# Patient Record
Sex: Male | Born: 1955 | Race: Black or African American | Hispanic: No | Smoking: Never smoker
Health system: Southern US, Community
[De-identification: ages and names within clinical notes are randomized; demographics above are authoritative.]

## PROBLEM LIST (undated history)

## (undated) DIAGNOSIS — E119 Type 2 diabetes mellitus without complications: Secondary | ICD-10-CM

## (undated) DIAGNOSIS — I10 Essential (primary) hypertension: Secondary | ICD-10-CM

---

## 2019-06-08 ENCOUNTER — Other Ambulatory Visit: Payer: Self-pay

## 2019-06-08 ENCOUNTER — Emergency Department: Payer: No Typology Code available for payment source

## 2019-06-08 ENCOUNTER — Inpatient Hospital Stay
Admission: EM | Admit: 2019-06-08 | Discharge: 2019-06-09 | DRG: 177 | Disposition: A | Discharge status: Other Acute Inpatient Hospital | Payer: No Typology Code available for payment source | Attending: Family Medicine | Admitting: Family Medicine

## 2019-06-08 DIAGNOSIS — Z20828 Contact with and (suspected) exposure to other viral communicable diseases: Secondary | ICD-10-CM

## 2019-06-08 DIAGNOSIS — E669 Obesity, unspecified: Secondary | ICD-10-CM | POA: Diagnosis present

## 2019-06-08 DIAGNOSIS — E119 Type 2 diabetes mellitus without complications: Secondary | ICD-10-CM | POA: Diagnosis present

## 2019-06-08 DIAGNOSIS — J189 Pneumonia, unspecified organism: Secondary | ICD-10-CM

## 2019-06-08 DIAGNOSIS — Z7984 Long term (current) use of oral hypoglycemic drugs: Secondary | ICD-10-CM

## 2019-06-08 DIAGNOSIS — Z6839 Body mass index (BMI) 39.0-39.9, adult: Secondary | ICD-10-CM | POA: Diagnosis not present

## 2019-06-08 DIAGNOSIS — U071 COVID-19: Principal | ICD-10-CM

## 2019-06-08 DIAGNOSIS — I10 Essential (primary) hypertension: Secondary | ICD-10-CM | POA: Diagnosis present

## 2019-06-08 DIAGNOSIS — R071 Chest pain on breathing: Secondary | ICD-10-CM

## 2019-06-08 DIAGNOSIS — J1282 Pneumonia due to coronavirus disease 2019: Secondary | ICD-10-CM | POA: Diagnosis present

## 2019-06-08 DIAGNOSIS — I2489 Other forms of acute ischemic heart disease: Secondary | ICD-10-CM

## 2019-06-08 DIAGNOSIS — E876 Hypokalemia: Secondary | ICD-10-CM

## 2019-06-08 DIAGNOSIS — J1289 Other viral pneumonia: Secondary | ICD-10-CM | POA: Diagnosis present

## 2019-06-08 DIAGNOSIS — R531 Weakness: Secondary | ICD-10-CM | POA: Diagnosis present

## 2019-06-08 DIAGNOSIS — R0781 Pleurodynia: Secondary | ICD-10-CM

## 2019-06-08 DIAGNOSIS — R778 Other specified abnormalities of plasma proteins: Secondary | ICD-10-CM | POA: Diagnosis present

## 2019-06-08 DIAGNOSIS — I248 Other forms of acute ischemic heart disease: Secondary | ICD-10-CM | POA: Diagnosis present

## 2019-06-08 DIAGNOSIS — Z79899 Other long term (current) drug therapy: Secondary | ICD-10-CM | POA: Diagnosis not present

## 2019-06-08 DIAGNOSIS — Z20822 Contact with and (suspected) exposure to covid-19: Secondary | ICD-10-CM

## 2019-06-08 DIAGNOSIS — N179 Acute kidney failure, unspecified: Secondary | ICD-10-CM | POA: Diagnosis present

## 2019-06-08 HISTORY — DX: Type 2 diabetes mellitus without complications: E11.9

## 2019-06-08 HISTORY — DX: Essential (primary) hypertension: I10

## 2019-06-08 LAB — CBC
HCT: 33.5 % — ABNORMAL LOW (ref 39.0–52.0)
Hemoglobin: 11.7 g/dL — ABNORMAL LOW (ref 13.0–17.0)
MCH: 28.9 pg (ref 26.0–34.0)
MCHC: 34.9 g/dL (ref 30.0–36.0)
MCV: 82.7 fL (ref 80.0–100.0)
Platelets: 169 10*3/uL (ref 150–400)
RBC: 4.05 MIL/uL — ABNORMAL LOW (ref 4.22–5.81)
RDW: 13 % (ref 11.5–15.5)
WBC: 4 10*3/uL (ref 4.0–10.5)
nRBC: 0 % (ref 0.0–0.2)

## 2019-06-08 LAB — BASIC METABOLIC PANEL
Anion gap: 10 (ref 5–15)
BUN: 18 mg/dL (ref 8–23)
CO2: 22 mmol/L (ref 22–32)
Calcium: 8.2 mg/dL — ABNORMAL LOW (ref 8.9–10.3)
Chloride: 105 mmol/L (ref 98–111)
Creatinine, Ser: 1.94 mg/dL — ABNORMAL HIGH (ref 0.61–1.24)
GFR calc Af Amer: 42 mL/min — ABNORMAL LOW (ref >60)
GFR calc non Af Amer: 36 mL/min — ABNORMAL LOW (ref >60)
Glucose, Bld: 129 mg/dL — ABNORMAL HIGH (ref 70–99)
Potassium: 3.2 mmol/L — ABNORMAL LOW (ref 3.5–5.1)
Sodium: 137 mmol/L (ref 135–145)

## 2019-06-08 LAB — TROPONIN I (HIGH SENSITIVITY)
Troponin I (High Sensitivity): 137 ng/L (ref <18)
Troponin I (High Sensitivity): 104 ng/L (ref <18)

## 2019-06-08 LAB — GLUCOSE, CAPILLARY: Glucose-Capillary: 129 mg/dL — ABNORMAL HIGH (ref 70–99)

## 2019-06-08 LAB — POC SARS CORONAVIRUS 2 AG: SARS Coronavirus 2 Ag: POSITIVE — AB

## 2019-06-08 MED ORDER — INSULIN ASPART 100 UNIT/ML ~~LOC~~ SOLN: 0.0000 [IU] | Freq: Three times a day (TID) | SUBCUTANEOUS | Status: DC

## 2019-06-08 MED ORDER — ACETAMINOPHEN 650 MG RE SUPP: 650.0000 mg | Freq: Four times a day (QID) | RECTAL | Status: DC | PRN

## 2019-06-08 MED ORDER — SODIUM CHLORIDE 0.9% FLUSH: 10.0000 mL | Freq: Two times a day (BID) | INTRAVENOUS | Status: DC

## 2019-06-08 MED ORDER — ONDANSETRON HCL 4 MG/2ML IJ SOLN
4.0000 mg | Freq: Once | INTRAMUSCULAR | Status: AC
  Administered 2019-06-09: 4 mg via INTRAVENOUS
  Filled 2019-06-08: qty 2

## 2019-06-08 MED ORDER — SODIUM CHLORIDE 0.9 % IV BOLUS
500.0000 mL | Freq: Once | INTRAVENOUS | Status: AC
  Administered 2019-06-09: 500 mL via INTRAVENOUS

## 2019-06-08 MED ORDER — MORPHINE SULFATE (PF) 4 MG/ML IV SOLN
4.0000 mg | Freq: Once | INTRAVENOUS | Status: AC
  Administered 2019-06-09: 4 mg via INTRAVENOUS
  Filled 2019-06-08: qty 1

## 2019-06-08 MED ORDER — SODIUM CHLORIDE 0.9% FLUSH: 10.0000 mL | INTRAVENOUS | Status: DC | PRN

## 2019-06-08 MED ORDER — GUAIFENESIN ER 600 MG PO TB12
600.0000 mg | ORAL_TABLET | Freq: Two times a day (BID) | ORAL | Status: DC
  Administered 2019-06-09: 600 mg via ORAL
  Filled 2019-06-08: qty 1

## 2019-06-08 MED ORDER — ONDANSETRON HCL 4 MG PO TABS: 4.0000 mg | ORAL_TABLET | Freq: Four times a day (QID) | ORAL | Status: DC | PRN

## 2019-06-08 MED ORDER — SODIUM CHLORIDE 0.9% FLUSH: 3.0000 mL | Freq: Once | INTRAVENOUS | Status: DC

## 2019-06-08 MED ORDER — ACETAMINOPHEN 325 MG PO TABS
650.0000 mg | ORAL_TABLET | Freq: Four times a day (QID) | ORAL | Status: DC | PRN
  Administered 2019-06-09 (×2): 650 mg via ORAL
  Filled 2019-06-08 (×2): qty 2

## 2019-06-08 MED ORDER — SODIUM CHLORIDE 0.9 % IV SOLN
2.0000 g | INTRAVENOUS | Status: DC
  Administered 2019-06-09: 2 g via INTRAVENOUS
  Filled 2019-06-08 (×2): qty 20

## 2019-06-08 MED ORDER — SODIUM CHLORIDE 0.9 % IV SOLN
INTRAVENOUS | Status: DC
  Administered 2019-06-09: 04:00:00 via INTRAVENOUS

## 2019-06-08 MED ORDER — SODIUM CHLORIDE 0.9 % IV SOLN
500.0000 mg | INTRAVENOUS | Status: DC
  Filled 2019-06-08: qty 500

## 2019-06-08 MED ORDER — MAGNESIUM HYDROXIDE 400 MG/5ML PO SUSP
30.0000 mL | Freq: Every day | ORAL | Status: DC | PRN
  Filled 2019-06-08: qty 30

## 2019-06-08 MED ORDER — ASPIRIN 81 MG PO CHEW
324.0000 mg | CHEWABLE_TABLET | Freq: Once | ORAL | Status: AC
  Administered 2019-06-09: 324 mg via ORAL
  Filled 2019-06-08: qty 4

## 2019-06-08 MED ORDER — ONDANSETRON HCL 4 MG/2ML IJ SOLN: 4.0000 mg | Freq: Four times a day (QID) | INTRAMUSCULAR | Status: DC | PRN

## 2019-06-08 MED ORDER — HYDROCOD POLST-CPM POLST ER 10-8 MG/5ML PO SUER: 5.0000 mL | Freq: Two times a day (BID) | ORAL | Status: DC | PRN

## 2019-06-08 MED ORDER — ENOXAPARIN SODIUM 40 MG/0.4ML ~~LOC~~ SOLN
40.0000 mg | SUBCUTANEOUS | Status: DC
  Filled 2019-06-08: qty 0.4

## 2019-06-08 MED ORDER — TRAZODONE HCL 50 MG PO TABS
25.0000 mg | ORAL_TABLET | Freq: Every evening | ORAL | Status: DC | PRN
  Filled 2019-06-08: qty 0.5

## 2019-06-08 MED ORDER — SODIUM CHLORIDE 0.9 % IV SOLN
500.0000 mg | Freq: Once | INTRAVENOUS | Status: AC
  Administered 2019-06-09: 500 mg via INTRAVENOUS
  Filled 2019-06-08: qty 500

## 2019-06-08 NOTE — ED Notes (Signed)
IV team at bedside 

## 2019-06-08 NOTE — ED Notes (Signed)
Pt holding his chest and states he is having a little pain there that is radiating to his shoulder.

## 2019-06-08 NOTE — ED Triage Notes (Addendum)
[place] 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 Pt states this started 4-5 days ago. Pt states he has been feeling tired and just sleeping all the time.   Pt states CP and SOB a couple days ago and radiation to his shoulder. Pt states no pain at this time.  Pt states his balance is off too. Pt states decreased oral intake  Pt is diabetic and said he hasn't been able to check his BS lately. Current BS check is 127.

## 2019-06-08 NOTE — ED Provider Notes (Signed)
[facility] Emergency Department Provider Note ____________________________________________   First MD Initiated Contact with Patient 06/08/19 2115     (approximate)  I have reviewed the triage vital signs and the nursing notes.   HISTORY  Chief Complaint Weakness    HPI Jd Mccaster is a 63 y.o. male here for evaluation of chest discomfort, cough fatigue and weakness  Patient reports for about 1 week now has been experiencing a cough, some fatigue.  Loose watery stools.  No known exposure to Covid and denies having a fever but has been coughing and feeling weak.  Reports he started feel like he had "pneumonia".  He also notes when he takes a deep breath gets a sharp discomfort under his chest.  Not in pain or significant discomfort if you take shallow breaths when he takes a deep breath he has pain across his chest  Denies history of blood clots.  Does have history of diabetes hypertension   Past Medical History:  Diagnosis Date  . Diabetes mellitus without complication (HCC)   . Hypertension     Patient Active Problem List   Diagnosis Date Noted  . CAP (community acquired pneumonia) 06/08/2019    History reviewed. No pertinent surgical history.  Prior to Admission medications   Medication Sig Start Date End Date Taking? Authorizing Provider  amLODipine (NORVASC) 10 MG tablet Take 10 mg by mouth daily. 05/27/19  Yes [provider]  glipiZIDE (GLUCOTROL XL) 5 MG 24 hr tablet Take 5 mg by mouth daily. 05/27/19  Yes [provider]  hydrochlorothiazide (MICROZIDE) 12.5 MG capsule Take 12.5 mg by mouth daily. 05/27/19  Yes [provider]  ibuprofen (ADVIL) 600 MG tablet Take 600 mg by mouth 2 (two) times daily as needed. 05/29/19  Yes [provider]  losartan (COZAAR) 50 MG tablet Take 50 mg by mouth daily. 05/27/19  Yes [provider]  metFORMIN (GLUCOPHAGE-XR) 750 MG 24 hr tablet Take 750 mg by  mouth daily. 05/27/19  Yes [provider]  terbinafine (LAMISIL) 250 MG tablet Take 250 mg by mouth daily. 05/29/19  Yes [provider]    Allergies Patient has no known allergies.  No family history on file.  Social History Social History   Tobacco Use  . Smoking status: Never Smoker  . Smokeless tobacco: Never Used  Substance Use Topics  . Alcohol use: Yes    Comment: occassionally  . Drug use: Never    Review of Systems Constitutional: No fever/chills but having fatigue Eyes: No visual changes. ENT: No sore throat. Cardiovascular: Denies chest pain. Respiratory: Slight shortness of breath, dry nonproductive cough, pain with coughing Gastrointestinal: No abdominal pain.  Loose stools occasionally Musculoskeletal: Negative for back pain. Skin: Negative for rash. Neurological: Negative for headaches or weakness but feeling fatigued    ____________________________________________   PHYSICAL EXAM:  VITAL SIGNS: ED Triage Vitals [06/08/19 1805]  Enc Vitals Group     BP 118/67     Pulse Rate 84     Resp 18     Temp 100 F (37.8 C)     Temp src      SpO2 95 %     Weight 300 lb (136.1 kg)     Height 6\' 1"  (1.854 m)     Head Circumference      Peak Flow      Pain Score 4     Pain Loc      Pain Edu?  Excl. in GC?     Constitutional: Alert and oriented. Well appearing and in no acute distress. Eyes: Conjunctivae are normal. Head: Atraumatic. Nose: No congestion/rhinnorhea. Mouth/Throat: Mucous membranes are moist. Neck: No stridor.  Cardiovascular: Normal rate, regular rhythm. Grossly normal heart sounds.  Good peripheral circulation. Respiratory: Normal respiratory effort.  No retractions. Lungs CTAB. Gastrointestinal: Soft and nontender. No distention. Musculoskeletal: No lower extremity tenderness nor edema. Neurologic:  Normal speech and language. No gross focal neurologic deficits are appreciated.  Skin:  Skin is warm, dry and  intact. No rash noted. Psychiatric: Mood and affect are normal. Speech and behavior are normal.  ____________________________________________   LABS (all labs ordered are listed, but only abnormal results are displayed)  Labs Reviewed  BASIC METABOLIC PANEL - Abnormal; Notable for the following components:      Result Value   Potassium 3.2 (*)    Glucose, Bld 129 (*)    Creatinine, Ser 1.94 (*)    Calcium 8.2 (*)    GFR calc non Af Amer 36 (*)    GFR calc Af Amer 42 (*)    All other components within normal limits  CBC - Abnormal; Notable for the following components:   RBC 4.05 (*)    Hemoglobin 11.7 (*)    HCT 33.5 (*)    All other components within normal limits  GLUCOSE, CAPILLARY - Abnormal; Notable for the following components:   Glucose-Capillary 129 (*)    All other components within normal limits  POC SARS CORONAVIRUS 2 AG - Abnormal; Notable for the following components:   SARS Coronavirus 2 Ag POSITIVE (*)    All other components within normal limits  TROPONIN I (HIGH SENSITIVITY) - Abnormal; Notable for the following components:   Troponin I (High Sensitivity) 137 (*)    All other components within normal limits  TROPONIN I (HIGH SENSITIVITY) - Abnormal; Notable for the following components:   Troponin I (High Sensitivity) 104 (*)    All other components within normal limits  SARS CORONAVIRUS 2 (TAT 6-24 HRS)  CULTURE, BLOOD (ROUTINE X 2)  CULTURE, BLOOD (ROUTINE X 2)  SARS CORONAVIRUS 2 BY RT PCR (HOSPITAL ORDER, PERFORMED IN [facility])  EXPECTORATED SPUTUM ASSESSMENT W REFEX TO RESP CULTURE  HIV ANTIBODY (ROUTINE TESTING W REFLEX)  BASIC METABOLIC PANEL  CBC  LEGIONELLA PNEUMOPHILA SEROGP 1 UR AG  STREP PNEUMONIAE URINARY ANTIGEN  HEMOGLOBIN A1C  FERRITIN  C-REACTIVE PROTEIN  LACTATE DEHYDROGENASE  FIBRIN DERIVATIVES D-DIMER ([facility] ONLY)   ____________________________________________  EKG  Reviewed interpreted at 2130 Heart rate 80  QRS 90 QTc 469 normal sinus rhythm, occasional PVCs.  No evidence of acute ischemia ____________________________________________  RADIOLOGY  Dg Chest 2 View  Result Date: 06/08/2019 CLINICAL DATA:  Truck driver with weakness, diarrhea, tired and sleeping all the time, onset of symptoms 4-5 days ago, chest pain and shortness of breath a couple days ago with radiation to shoulder but no pain at this time EXAM: CHEST - 2 VIEW COMPARISON:  None FINDINGS: Upper normal size of cardiac silhouette. Mediastinal contours and pulmonary vascularity normal. Mild RIGHT basilar atelectasis. A more focal area of nodular opacity 2.4 x 1.4 cm projects over the anterior RIGHT sixth rib, uncertain if represents superimposed infiltrate, pulmonary nodule or sclerosis within the anterior RIGHT sixth rib. Remaining lungs clear. No pleural effusion or pneumothorax. No additional osseous findings. IMPRESSION: Mild RIGHT basilar atelectasis with questionable area of infiltrate versus pulmonary nodule or sixth rib sclerosis at RIGHT  lung base. Either radiographic follow-up until resolution or CT chest recommended to exclude pulmonary nodule. Electronically Signed   By: Lavonia Dana M.D.   On: 06/08/2019 19:02    CT angiogram pending at time of transfer to hospital service ____________________________________________   PROCEDURES  Procedure(s) performed: None  Procedures  Critical Care performed: No  ____________________________________________   INITIAL IMPRESSION / ASSESSMENT AND PLAN / ED COURSE  Pertinent labs & imaging results that were available during my care of the patient were reviewed by me and considered in my medical decision making (see chart for details).   Initially covered for possible community-acquired pneumonia, however Covid test returned positive.  This does seem to explain his symptomatology well.  Suspect also underlying demand ischemia with his troponin being slightly elevated but not  uptrending.  Very pleuritic in nature.  Clinical Course as of Jun 08 2  Fri Jun 08, 2019  2303 RN reports POSITIVE COVID-19 ANTIGEN   [MQ]    Clinical Course User Index [MQ] Delman Kitten, MD   ----------------------------------------- 12:02 AM on 06/09/2019 -----------------------------------------  Patient presentation and lab work concerning and positive for Covid.  Will admit here, discussed case with Dr. Sidney Ace.  Patient agreeable with plan.  ____________________________________________   FINAL CLINICAL IMPRESSION(S) / ED DIAGNOSES  Final diagnoses:  Demand ischemia of myocardium ([place])  Pleuritic chest pain  COVID-19 virus test result unknown  Community acquired pneumonia, unspecified laterality        Note:  This document was prepared using Systems analyst and may include unintentional dictation errors       Delman Kitten, MD 06/09/19 0003

## 2019-06-08 NOTE — ED Notes (Signed)
Pt taken to Xray, will draw labs when he returns

## 2019-06-08 NOTE — H&P (Signed)
[place] [facility] [place] NAME: James York    MR#:  [id number]  DATE OF BIRTH:  [birth year]  DATE OF ADMISSION:  06/08/2019  PRIMARY CARE PHYSICIAN: Patient, [name]   REQUESTING/REFERRING PHYSICIAN: Delman Kitten, MD  CHIEF COMPLAINT:   Chief Complaint  Patient presents with  . Weakness    HISTORY OF PRESENT ILLNESS:  James York  is a 63 y.o. obese African-American male with a known history of type 2 diabetes mellitus and hypertension, presented to the emergency room acute onset of dry cough for the last few days with associated chest pain mainly with cough or deep breathing and dyspnea.  He admitted to cold chills but has not checked his temperature.  He has been having watery bowel movements over the last 4 days.  He denied any loss of taste or smell.  He has been having generalized weakness but denied any body aches.  He has not had much appetite over the last 4 to 5 days.  No abdominal pain or melena or bright red bleeding per rectum.  No other bleeding diathesis.  No headache or dizziness or blurred vision.  Upon presentation to the emergency room, temperature was 100 and pulse oximetry 95% and later 93% on room air with respiratory rate of 18 and later 37.  EKG showed no sinus rhythm with a rate of 80 with PVCs.  Labs revealed a troponin I 137 and later 104 and mild hypokalemia with potassium of three-point and a creatinine of 1.92 with a BUN of 18.  Two-view chest x-ray showed mild right basal atelectasis with questionable area of infiltrate versus pulmonary nodule or sixth rib sclerosis at the right lung base.  Chest CTA is currently pending.  The patient had a rapid COVID-19 antigen test that came back positive.  He was given 4 mg IV morphine sulfate and 4 mg of IV Zofran twice, IV Zithromax and 500 mill of IV normal saline.  He will be admitted to a medically monitored bed for further evaluation and management.  PAST MEDICAL HISTORY:   Past  Medical History:  Diagnosis Date  . Diabetes mellitus without complication ([place])   . Hypertension     PAST SURGICAL HISTORY:  History reviewed. No pertinent surgical history. He denies any previous surgeries. SOCIAL HISTORY:   Social History   Tobacco Use  . Smoking status: Never Smoker  . Smokeless tobacco: Never Used  Substance Use Topics  . Alcohol use: Yes    Comment: occassionally    FAMILY HISTORY:  No family history on file.  DRUG ALLERGIES:  Not on File  REVIEW OF SYSTEMS:   ROS As per history of present illness. All pertinent systems were reviewed above. Constitutional,  HEENT, cardiovascular, respiratory, GI, GU, musculoskeletal, neuro, psychiatric, endocrine,  integumentary and hematologic systems were reviewed and are otherwise  negative/unremarkable except for positive findings mentioned above in the HPI.   MEDICATIONS AT HOME:   Prior to Admission medications   Not on File  Glucophage 750 mg p.o. daily Glipizide 5 mg p.o. daily Amlodipine 10 mg p.o. daily Hyzaar 50/12.5 mg p.o. daily    VITAL SIGNS:  Blood pressure 138/68, pulse 81, temperature 100 F (37.8 C), resp. rate (!) 37, height 6\' 1"  (1.854 m), weight 136.1 kg, SpO2 93 %.  PHYSICAL EXAMINATION:  Physical Exam  GENERAL:  63 y.o.-year-old patient lying in the bed with no acute distress.  EYES: Pupils equal, round, reactive to light and accommodation.  No scleral icterus. Extraocular muscles intact.  HEENT: Head atraumatic, normocephalic. Oropharynx and nasopharynx clear.  NECK:  Supple, no jugular venous distention. No thyroid enlargement, no tenderness.  LUNGS: Normal breath sounds bilaterally, no wheezing, rales,rhonchi or crepitation. No use of accessory muscles of respiration.  CARDIOVASCULAR: Regular rate and rhythm, S1, S2 normal. No murmurs, rubs, or gallops.  ABDOMEN: Soft, nondistended, nontender. Bowel sounds present. No organomegaly or mass.  EXTREMITIES: No pedal edema,  cyanosis, or clubbing.  NEUROLOGIC: Cranial nerves II through XII are intact. Muscle strength 5/5 in all extremities. Sensation intact. Gait not checked.  PSYCHIATRIC: The patient is alert and oriented x 3.  Normal affect and good eye contact. SKIN: No obvious rash, lesion, or ulcer.   LABORATORY PANEL:   CBC Recent Labs  Lab 06/08/19 1810  WBC 4.0  HGB 11.7*  HCT 33.5*  PLT 169   ------------------------------------------------------------------------------------------------------------------  Chemistries  Recent Labs  Lab 06/08/19 1810  NA 137  K 3.2*  CL 105  CO2 22  GLUCOSE 129*  BUN 18  CREATININE 1.94*  CALCIUM 8.2*   ------------------------------------------------------------------------------------------------------------------  Cardiac Enzymes No results for input(s): TROPONINI in the last 168 hours. 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 Mediastinal contours and pulmonary vascularity normal. Mild RIGHT basilar atelectasis. A more focal area of nodular opacity 2.4 x 1.4 cm projects over the anterior RIGHT sixth rib, uncertain if represents superimposed infiltrate, pulmonary nodule or sclerosis within the anterior RIGHT sixth rib. Remaining lungs clear. No pleural effusion or pneumothorax. No additional osseous findings. IMPRESSION: Mild RIGHT basilar atelectasis with questionable area of infiltrate versus pulmonary nodule or sixth rib sclerosis at RIGHT lung base. Either radiographic follow-up until resolution or CT chest  recommended to exclude pulmonary nodule. Electronically Signed   By: Ulyses Southward M.D.   On: 06/08/2019 19:02      IMPRESSION AND PLAN:   1.  Covid-19 left-sided community-acquired pneumonia.  Patient will be admitted to a medical monitored bed.  He will be continued on antibiotic therapy with IV Rocephin and Zithromax.  Mucolytic therapy will be provided.  Sputum Gram stain and culture and sensitivity will be obtained and will follow blood cultures.  Will obtain pneumonia antigens.  He will be placed on isolation.  Will obtain inflammatory markers.  Should they come back elevated we will start him on Decadron.  I will hold off on remdesivir at this time.  He has no significant hypoxia.  Awaiting D-dimer results.  With improvement of his creatinine we can obtain chest CTA.  After contact with [facility], the recommendation was for admission here given lack of cardiology consultation service there.  We will give the patient 1 dose of therapeutic Lovenox pending chest CTA in a.m. hopefully with improving creatinine.  2.  Hypokalemia.  Potassium will be placed in magnesium level will be checked.  3.  Chest pain, likely pleuritic with elevated troponin I.  This could be related to demand ischemia secondary to #1.  Will follow further levels in a.m.  Given his positive Covid will obtain 2D echo and a cardiology consult by Dr. Juliann Pares.  I notified him regarding the patient.Marland Kitchen  The patient will receive 1 dose of therapeutic Lovenox.  4.  Acute kidney injury.  He will be hydrated with IV normal saline and will follow his BMP.  His Hyzaar as well as Metformin will be held off.  5.  Type 2 diabetes mellitus.  He will be placed on supplement coverage with NovoLog.  We will hold off his metformin.  We will continue glipizide with holding parameters.  6.  Hypertension.  We will continue his amlodipine and hold off his Hyzaar.  7.  DVT prophylaxis.  Subcutaneous Lovenox  All the records are reviewed and case  discussed with ED provider. The plan of care was discussed in details with the patient (and family). I answered all questions. The patient agreed to proceed with the above mentioned plan. Further management will depend upon hospital course.   CODE STATUS: Full code  TOTAL TIME TAKING CARE OF THIS PATIENT: 55 minutes.    Hannah BeatJan A Olita Takeshita M.D on 06/08/2019 at 10:25 PM  [facility]   From 7 PM-7 AM, contact night-coverage [email or web address]  CC: Primary care physician; Patient, [name]   Note: This dictation was prepared with Dragon dictation along with smaller phrase technology. Any transcriptional errors that result from this process are unintentional.

## 2019-06-08 NOTE — ED Notes (Signed)
Also collected blue top, sent to lab.

## 2019-06-08 NOTE — ED Notes (Signed)
Patient with troponin of 137. Lea charge RN notified.

## 2019-06-09 ENCOUNTER — Encounter: Payer: Self-pay | Admitting: Radiology

## 2019-06-09 ENCOUNTER — Inpatient Hospital Stay: Payer: No Typology Code available for payment source

## 2019-06-09 ENCOUNTER — Inpatient Hospital Stay (HOSPITAL_COMMUNITY)
Admission: AD | Admit: 2019-06-09 | Discharge: 2019-06-16 | DRG: 871 | Disposition: A | Discharge status: Home / Self Care | Payer: No Typology Code available for payment source | Source: Other Acute Inpatient Hospital | Attending: Internal Medicine | Admitting: Internal Medicine

## 2019-06-09 DIAGNOSIS — Z7984 Long term (current) use of oral hypoglycemic drugs: Secondary | ICD-10-CM | POA: Diagnosis not present

## 2019-06-09 DIAGNOSIS — N179 Acute kidney failure, unspecified: Secondary | ICD-10-CM | POA: Diagnosis not present

## 2019-06-09 DIAGNOSIS — I129 Hypertensive chronic kidney disease with stage 1 through stage 4 chronic kidney disease, or unspecified chronic kidney disease: Secondary | ICD-10-CM | POA: Diagnosis present

## 2019-06-09 DIAGNOSIS — A4189 Other specified sepsis: Secondary | ICD-10-CM | POA: Diagnosis present

## 2019-06-09 DIAGNOSIS — E1165 Type 2 diabetes mellitus with hyperglycemia: Secondary | ICD-10-CM

## 2019-06-09 DIAGNOSIS — N1832 Chronic kidney disease, stage 3b: Secondary | ICD-10-CM | POA: Diagnosis present

## 2019-06-09 DIAGNOSIS — E1122 Type 2 diabetes mellitus with diabetic chronic kidney disease: Secondary | ICD-10-CM | POA: Diagnosis present

## 2019-06-09 DIAGNOSIS — Z789 Other specified health status: Secondary | ICD-10-CM

## 2019-06-09 DIAGNOSIS — J1289 Other viral pneumonia: Secondary | ICD-10-CM | POA: Diagnosis not present

## 2019-06-09 DIAGNOSIS — J1282 Pneumonia due to coronavirus disease 2019: Secondary | ICD-10-CM | POA: Diagnosis present

## 2019-06-09 DIAGNOSIS — R197 Diarrhea, unspecified: Secondary | ICD-10-CM | POA: Diagnosis present

## 2019-06-09 DIAGNOSIS — R0781 Pleurodynia: Secondary | ICD-10-CM | POA: Diagnosis not present

## 2019-06-09 DIAGNOSIS — Z8249 Family history of ischemic heart disease and other diseases of the circulatory system: Secondary | ICD-10-CM | POA: Diagnosis not present

## 2019-06-09 DIAGNOSIS — I2510 Atherosclerotic heart disease of native coronary artery without angina pectoris: Secondary | ICD-10-CM | POA: Diagnosis present

## 2019-06-09 DIAGNOSIS — U071 COVID-19: Secondary | ICD-10-CM | POA: Diagnosis present

## 2019-06-09 DIAGNOSIS — I10 Essential (primary) hypertension: Secondary | ICD-10-CM | POA: Diagnosis not present

## 2019-06-09 DIAGNOSIS — A419 Sepsis, unspecified organism: Secondary | ICD-10-CM

## 2019-06-09 DIAGNOSIS — IMO0002 Reserved for concepts with insufficient information to code with codable children: Secondary | ICD-10-CM

## 2019-06-09 DIAGNOSIS — R6520 Severe sepsis without septic shock: Secondary | ICD-10-CM

## 2019-06-09 DIAGNOSIS — J9601 Acute respiratory failure with hypoxia: Secondary | ICD-10-CM | POA: Diagnosis not present

## 2019-06-09 DIAGNOSIS — E876 Hypokalemia: Secondary | ICD-10-CM

## 2019-06-09 LAB — GLUCOSE, CAPILLARY
Glucose-Capillary: 78 mg/dL (ref 70–99)
Glucose-Capillary: 105 mg/dL — ABNORMAL HIGH (ref 70–99)
Glucose-Capillary: 97 mg/dL (ref 70–99)
Glucose-Capillary: 187 mg/dL — ABNORMAL HIGH (ref 70–99)

## 2019-06-09 LAB — FERRITIN: Ferritin: 499 ng/mL — ABNORMAL HIGH (ref 24–336)

## 2019-06-09 LAB — HEPATIC FUNCTION PANEL
ALT: 27 U/L (ref 0–44)
AST: 50 U/L — ABNORMAL HIGH (ref 15–41)
Albumin: 2.9 g/dL — ABNORMAL LOW (ref 3.5–5.0)
Alkaline Phosphatase: 50 U/L (ref 38–126)
Bilirubin, Direct: 0.2 mg/dL (ref 0.0–0.2)
Indirect Bilirubin: 0.4 mg/dL (ref 0.3–0.9)
Total Bilirubin: 0.6 mg/dL (ref 0.3–1.2)
Total Protein: 6.4 g/dL — ABNORMAL LOW (ref 6.5–8.1)

## 2019-06-09 LAB — CBC
HCT: 33.2 % — ABNORMAL LOW (ref 39.0–52.0)
HCT: 31.4 % — ABNORMAL LOW (ref 39.0–52.0)
Hemoglobin: 10.7 g/dL — ABNORMAL LOW (ref 13.0–17.0)
Hemoglobin: 10.3 g/dL — ABNORMAL LOW (ref 13.0–17.0)
MCH: 28.2 pg (ref 26.0–34.0)
MCH: 28.8 pg (ref 26.0–34.0)
MCHC: 32.2 g/dL (ref 30.0–36.0)
MCHC: 32.8 g/dL (ref 30.0–36.0)
MCV: 87.4 fL (ref 80.0–100.0)
MCV: 87.7 fL (ref 80.0–100.0)
Platelets: 158 10*3/uL (ref 150–400)
Platelets: 158 10*3/uL (ref 150–400)
RBC: 3.8 MIL/uL — ABNORMAL LOW (ref 4.22–5.81)
RBC: 3.58 MIL/uL — ABNORMAL LOW (ref 4.22–5.81)
RDW: 13.1 % (ref 11.5–15.5)
RDW: 13.2 % (ref 11.5–15.5)
WBC: 4.5 10*3/uL (ref 4.0–10.5)
WBC: 3.6 10*3/uL — ABNORMAL LOW (ref 4.0–10.5)
nRBC: 0 % (ref 0.0–0.2)
nRBC: 0 % (ref 0.0–0.2)

## 2019-06-09 LAB — D-DIMER, QUANTITATIVE: D-Dimer, Quant: 1.04 ug/mL-FEU — ABNORMAL HIGH (ref 0.00–0.50)

## 2019-06-09 LAB — BASIC METABOLIC PANEL
Anion gap: 9 (ref 5–15)
BUN: 17 mg/dL (ref 8–23)
CO2: 24 mmol/L (ref 22–32)
Calcium: 7.6 mg/dL — ABNORMAL LOW (ref 8.9–10.3)
Chloride: 103 mmol/L (ref 98–111)
Creatinine, Ser: 1.82 mg/dL — ABNORMAL HIGH (ref 0.61–1.24)
GFR calc Af Amer: 45 mL/min — ABNORMAL LOW (ref >60)
GFR calc non Af Amer: 39 mL/min — ABNORMAL LOW (ref >60)
Glucose, Bld: 110 mg/dL — ABNORMAL HIGH (ref 70–99)
Potassium: 3.9 mmol/L (ref 3.5–5.1)
Sodium: 136 mmol/L (ref 135–145)

## 2019-06-09 LAB — HIV ANTIBODY (ROUTINE TESTING W REFLEX): HIV Screen 4th Generation wRfx: NONREACTIVE

## 2019-06-09 LAB — STREP PNEUMONIAE URINARY ANTIGEN: Strep Pneumo Urinary Antigen: NEGATIVE

## 2019-06-09 LAB — FIBRIN DERIVATIVES D-DIMER (ARMC ONLY): Fibrin derivatives D-dimer (ARMC): 1185.03 ng/mL (FEU) — ABNORMAL HIGH (ref 0.00–499.00)

## 2019-06-09 LAB — C-REACTIVE PROTEIN
CRP: 16.1 mg/dL — ABNORMAL HIGH (ref <1.0)
CRP: 13.2 mg/dL — ABNORMAL HIGH (ref <1.0)

## 2019-06-09 LAB — HEMOGLOBIN A1C
Hgb A1c MFr Bld: 7.4 % — ABNORMAL HIGH (ref 4.8–5.6)
Mean Plasma Glucose: 165.68 mg/dL

## 2019-06-09 LAB — TYPE AND SCREEN
ABO/RH(D): A POS
Antibody Screen: NEGATIVE

## 2019-06-09 LAB — SARS CORONAVIRUS 2 (TAT 6-24 HRS): SARS Coronavirus 2: POSITIVE — AB

## 2019-06-09 LAB — LACTATE DEHYDROGENASE: LDH: 320 U/L — ABNORMAL HIGH (ref 98–192)

## 2019-06-09 LAB — PROCALCITONIN: Procalcitonin: 0.12 ng/mL

## 2019-06-09 LAB — MAGNESIUM: Magnesium: 1.9 mg/dL (ref 1.7–2.4)

## 2019-06-09 MED ORDER — POLYETHYLENE GLYCOL 3350 17 G PO PACK: 17.0000 g | PACK | Freq: Every day | ORAL | Status: DC | PRN

## 2019-06-09 MED ORDER — ENOXAPARIN SODIUM 80 MG/0.8ML ~~LOC~~ SOLN: 70.0000 mg | Freq: Two times a day (BID) | SUBCUTANEOUS | Status: DC

## 2019-06-09 MED ORDER — IOHEXOL 350 MG/ML SOLN
75.0000 mL | Freq: Once | INTRAVENOUS | Status: AC | PRN
  Administered 2019-06-09: 75 mL via INTRAVENOUS

## 2019-06-09 MED ORDER — INSULIN DETEMIR 100 UNIT/ML ~~LOC~~ SOLN
20.0000 [IU] | Freq: Every day | SUBCUTANEOUS | Status: DC
  Filled 2019-06-09: qty 0.2

## 2019-06-09 MED ORDER — SODIUM CHLORIDE 0.9 % IV SOLN
100.0000 mg | Freq: Every day | INTRAVENOUS | Status: AC
  Administered 2019-06-10 – 2019-06-13 (×4): 100 mg via INTRAVENOUS
  Filled 2019-06-09 (×6): qty 20

## 2019-06-09 MED ORDER — DEXAMETHASONE SODIUM PHOSPHATE 10 MG/ML IJ SOLN
6.0000 mg | Freq: Two times a day (BID) | INTRAMUSCULAR | Status: DC
  Administered 2019-06-09 – 2019-06-14 (×11): 6 mg via INTRAVENOUS
  Filled 2019-06-09 (×10): qty 1

## 2019-06-09 MED ORDER — AMLODIPINE BESYLATE 10 MG PO TABS
10.0000 mg | ORAL_TABLET | Freq: Every day | ORAL | Status: DC
  Administered 2019-06-09 – 2019-06-15 (×6): 10 mg via ORAL
  Filled 2019-06-09 (×7): qty 1

## 2019-06-09 MED ORDER — ENOXAPARIN SODIUM 150 MG/ML ~~LOC~~ SOLN
136.0000 mg | Freq: Once | SUBCUTANEOUS | Status: AC
  Administered 2019-06-09: 136 mg via SUBCUTANEOUS
  Filled 2019-06-09: qty 0.91

## 2019-06-09 MED ORDER — ACETAMINOPHEN 325 MG PO TABS
650.0000 mg | ORAL_TABLET | Freq: Four times a day (QID) | ORAL | Status: DC | PRN
  Administered 2019-06-09 (×2): 650 mg via ORAL
  Filled 2019-06-09 (×2): qty 2

## 2019-06-09 MED ORDER — SODIUM CHLORIDE 0.9 % IV SOLN
100.0000 mg | Freq: Once | INTRAVENOUS | Status: AC
  Administered 2019-06-09: 100 mg via INTRAVENOUS
  Filled 2019-06-09 (×2): qty 20

## 2019-06-09 MED ORDER — ONDANSETRON HCL 4 MG/2ML IJ SOLN: 4.0000 mg | Freq: Four times a day (QID) | INTRAMUSCULAR | Status: DC | PRN

## 2019-06-09 MED ORDER — INSULIN ASPART 100 UNIT/ML ~~LOC~~ SOLN
0.0000 [IU] | Freq: Every day | SUBCUTANEOUS | Status: DC
  Administered 2019-06-10: 3 [IU] via SUBCUTANEOUS
  Administered 2019-06-13: 4 [IU] via SUBCUTANEOUS
  Administered 2019-06-14 – 2019-06-15 (×2): 2 [IU] via SUBCUTANEOUS

## 2019-06-09 MED ORDER — INSULIN DETEMIR 100 UNIT/ML ~~LOC~~ SOLN
10.0000 [IU] | Freq: Two times a day (BID) | SUBCUTANEOUS | Status: DC
  Administered 2019-06-09: 10 [IU] via SUBCUTANEOUS
  Filled 2019-06-09 (×2): qty 0.1

## 2019-06-09 MED ORDER — SODIUM CHLORIDE 0.9% FLUSH
3.0000 mL | INTRAVENOUS | Status: DC | PRN
  Administered 2019-06-09: 3 mL via INTRAVENOUS
  Filled 2019-06-09: qty 3

## 2019-06-09 MED ORDER — INSULIN ASPART 100 UNIT/ML ~~LOC~~ SOLN
0.0000 [IU] | Freq: Three times a day (TID) | SUBCUTANEOUS | Status: DC
  Administered 2019-06-10 (×2): 7 [IU] via SUBCUTANEOUS
  Administered 2019-06-10 – 2019-06-11 (×2): 4 [IU] via SUBCUTANEOUS
  Administered 2019-06-11 (×2): 7 [IU] via SUBCUTANEOUS
  Administered 2019-06-12: 13:00:00 4 [IU] via SUBCUTANEOUS
  Administered 2019-06-12: 7 [IU] via SUBCUTANEOUS
  Administered 2019-06-12 – 2019-06-13 (×2): 3 [IU] via SUBCUTANEOUS
  Administered 2019-06-13: 4 [IU] via SUBCUTANEOUS
  Administered 2019-06-13 – 2019-06-14 (×2): 11 [IU] via SUBCUTANEOUS
  Administered 2019-06-14: 3 [IU] via SUBCUTANEOUS
  Administered 2019-06-14 – 2019-06-15 (×2): 4 [IU] via SUBCUTANEOUS
  Administered 2019-06-15: 3 [IU] via SUBCUTANEOUS
  Administered 2019-06-15: 4 [IU] via SUBCUTANEOUS

## 2019-06-09 MED ORDER — ENOXAPARIN SODIUM 80 MG/0.8ML ~~LOC~~ SOLN
70.0000 mg | SUBCUTANEOUS | Status: DC
  Administered 2019-06-09 – 2019-06-15 (×7): 70 mg via SUBCUTANEOUS
  Filled 2019-06-09 (×7): qty 0.8

## 2019-06-09 MED ORDER — SODIUM CHLORIDE 0.9% FLUSH
3.0000 mL | Freq: Two times a day (BID) | INTRAVENOUS | Status: DC
  Administered 2019-06-10 – 2019-06-15 (×11): 3 mL via INTRAVENOUS

## 2019-06-09 MED ORDER — LABETALOL HCL 5 MG/ML IV SOLN: 5.0000 mg | INTRAVENOUS | Status: DC | PRN

## 2019-06-09 MED ORDER — ONDANSETRON HCL 4 MG PO TABS: 4.0000 mg | ORAL_TABLET | Freq: Four times a day (QID) | ORAL | Status: DC | PRN

## 2019-06-09 MED ORDER — SODIUM CHLORIDE 0.9 % IV SOLN: 250.0000 mL | INTRAVENOUS | Status: DC | PRN

## 2019-06-09 MED ORDER — POTASSIUM CHLORIDE 20 MEQ PO PACK
40.0000 meq | PACK | Freq: Once | ORAL | Status: AC
  Administered 2019-06-09: 40 meq via ORAL
  Filled 2019-06-09: qty 2

## 2019-06-09 MED ORDER — INSULIN ASPART 100 UNIT/ML ~~LOC~~ SOLN
6.0000 [IU] | Freq: Three times a day (TID) | SUBCUTANEOUS | Status: DC
  Administered 2019-06-09 – 2019-06-11 (×7): 6 [IU] via SUBCUTANEOUS

## 2019-06-09 MED ORDER — SODIUM CHLORIDE 0.9 % IV SOLN
200.0000 mg | Freq: Once | INTRAVENOUS | Status: AC
  Administered 2019-06-09: 100 mg via INTRAVENOUS
  Filled 2019-06-09: qty 40

## 2019-06-09 MED ORDER — SODIUM CHLORIDE 0.9% IV SOLUTION: Freq: Once | INTRAVENOUS | Status: DC

## 2019-06-09 NOTE — H&P (Signed)
History and Physical  James York OXB:[id number] DOB: [birth year] DOA: 06/09/2019  PCP: Patient, [name] Patient coming from: [facility]  I have personally briefly reviewed patient's old medical records in [place]   Chief Complaint: Shortness of breath  HPI: James York is a 63 y.o. male past medical history of essential hypertension, diabetes mellitus type 2 with an unknown hemoglobin A1c, presents to the emergency room for persistent dry cough and some chest pain.  Was noted about a week and a half prior to admission, accompanied by dyspnea.  He admits to fever and chills but did not check his temperature.  24 hours after his symptoms started he started having watery bowel movement that lasted about 4 days.  Since then he has had generalized fatigue, myalgias and generalized weakness.  He denies any dizziness upon standing blurry vision.    In the ED: He was found to be febrile breathing about 18-25 times per minute satting 92% on room air.  Troponins were mildly elevated, he was found to have mild hypokalemia and a creatinine of 1.9.  A chest x-ray was done that showed right basilar infiltrates  CT angio of the chest was done that was negative for PE and bilateral infiltrates.  SARS-CoV-2 test was positive on 06/08/2019.  The Rocephin and azithromycin was given 4 mg of morphine and IV Zofran.   Review of Systems: All systems reviewed and apart from history of presenting illness, are negative.  Past Medical History:  Diagnosis Date  . Diabetes mellitus without complication ([place])   . Hypertension    No past surgical history on file. Social History:  reports that he has never smoked. He has never used smokeless tobacco. He reports current alcohol use. He reports that he does not use drugs.   No Known Allergies  No family history on file.  Father died of a heart attack  Prior to Admission medications   Medication Sig Start Date End Date Taking?  Authorizing Provider  amLODipine (NORVASC) 10 MG tablet Take 10 mg by mouth daily. 05/27/19   [provider]  glipiZIDE (GLUCOTROL XL) 5 MG 24 hr tablet Take 5 mg by mouth daily. 05/27/19   [provider]  hydrochlorothiazide (MICROZIDE) 12.5 MG capsule Take 12.5 mg by mouth daily. 05/27/19   [provider]  ibuprofen (ADVIL) 600 MG tablet Take 600 mg by mouth 2 (two) times daily as needed. 05/29/19   [provider]  losartan (COZAAR) 50 MG tablet Take 50 mg by mouth daily. 05/27/19   [provider]  metFORMIN (GLUCOPHAGE-XR) 750 MG 24 hr tablet Take 750 mg by mouth daily. 05/27/19   [provider]  terbinafine (LAMISIL) 250 MG tablet Take 250 mg by mouth daily. 05/29/19   [provider]   Physical Exam: There were no vitals filed for this visit.   General exam: Moderately built and nourished patient, morbidly obese  Head, eyes and ENT: Nontraumatic and normocephalic. Pupils equally reacting to light and accommodation.  Dry mucous membrane  Neck: Supple. No JVD, carotid bruit or thyromegaly.  Lymphatics: No lymphadenopathy.  Respiratory system: Good air movement with crackles on the right lower lobe.  Cardiovascular system: S1 and S2 heard, RRR. No JVD.  Gastrointestinal system: Positive bowel sounds soft nontender nondistended.  Central nervous system: Alert and oriented. No focal neurological deficits.  Extremities: Symmetric 5 x 5 power. Peripheral pulses symmetrically felt.   Skin: No rashes or acute findings.  Musculoskeletal system:  Negative exam.  Psychiatry: Pleasant and cooperative.   Labs on Admission:  Basic Metabolic Panel: Recent Labs  Lab 06/08/19 1810 06/08/19 2048 06/09/19 0411  NA 137  --  136  K 3.2*  --  3.9  CL 105  --  103  CO2 22  --  24  GLUCOSE 129*  --  110*  BUN 18  --  17  CREATININE 1.94*  --  1.82*  CALCIUM 8.2*  --  7.6*  MG  --  1.9  --    Liver Function Tests:  No results for input(s): AST, ALT, ALKPHOS, BILITOT, PROT, ALBUMIN in the last 168 hours. No results for input(s): LIPASE, AMYLASE in the last 168 hours. No results for input(s): AMMONIA in the last 168 hours. CBC: Recent Labs  Lab 06/08/19 1810 06/09/19 0411  WBC 4.0 4.5  HGB 11.7* 10.7*  HCT 33.5* 33.2*  MCV 82.7 87.4  PLT 169 158   Cardiac Enzymes: No results for input(s): CKTOTAL, CKMB, CKMBINDEX, TROPONINI in the last 168 hours.  BNP (last 3 results) No results for input(s): PROBNP in the last 8760 hours. CBG: Recent Labs  Lab 06/08/19 1809 06/09/19 0838 06/09/19 1203  GLUCAP 129* 78 105*    Radiological Exams on Admission: Dg Chest 2 View  Result Date: 06/08/2019 CLINICAL DATA:  Truck driver with weakness, diarrhea, tired and sleeping all the time, onset of symptoms 4-5 days ago, chest pain and shortness of breath a couple days ago with radiation to shoulder but no pain at this time EXAM: CHEST - 2 VIEW COMPARISON:  None FINDINGS: Upper normal size of cardiac silhouette. Mediastinal contours and pulmonary vascularity normal. Mild RIGHT basilar atelectasis. A more focal area of nodular opacity 2.4 x 1.4 cm projects over the anterior RIGHT sixth rib, uncertain if represents superimposed infiltrate, pulmonary nodule or sclerosis within the anterior RIGHT sixth rib. Remaining lungs clear. No pleural effusion or pneumothorax. No additional osseous findings. IMPRESSION: Mild RIGHT basilar atelectasis with questionable area of infiltrate versus pulmonary nodule or sixth rib sclerosis at RIGHT lung base. Either radiographic follow-up until resolution or CT chest recommended to exclude pulmonary nodule. Electronically Signed   By: Ulyses Southward M.D.   On: 06/08/2019 19:02   Ct Angio Chest Pe W And/or Wo Contrast  Result Date: 06/09/2019 CLINICAL DATA:  Positive COVID-19 test. Chest pain and shortness of breath. EXAM: CT ANGIOGRAPHY CHEST WITH CONTRAST TECHNIQUE: Multidetector CT  imaging of the chest was performed using the standard protocol during bolus administration of intravenous contrast. Multiplanar CT image reconstructions and MIPs were obtained to evaluate the vascular anatomy. CONTRAST:  55mL OMNIPAQUE IOHEXOL 350 MG/ML SOLN COMPARISON:  Chest x-ray June 08, 2019 FINDINGS: Cardiovascular: The heart is normal in size. Mild coronary artery calcifications are seen in the LAD. The thoracic aorta is normal. Central pulmonary arteries are normal in caliber. Evaluation for pulmonary emboli is somewhat limited due to respiratory motion and stairstep artifact. Within these limitations, no pulmonary emboli are identified. Mediastinum/Nodes: Shotty reactive nodes in the mediastinum. No effusions. The thyroid and esophagus are normal. There is a small pericardial effusion measuring 7 mm in thickness. Lungs/Pleura: Central airways are normal. No pneumothorax. Bilateral pulmonary infiltrates involving all lobes are consistent with the patient's COVID-19 diagnosis. Upper Abdomen: No acute abnormality. Musculoskeletal: No chest wall abnormality. No acute or significant osseous findings. Review of the MIP images confirms the above findings. IMPRESSION: 1. Bilateral pulmonary infiltrates consistent with the patient's COVID-19 diagnosis. 2. Evaluation for pulmonary  emboli is somewhat limited due to respiratory motion and stairstep artifact. However, within these limitations, no emboli are seen. 3. Mild coronary artery calcifications in the LAD. 4. Reactive nodes in the mediastinum. Electronically Signed   By: Gerome Samavid  Williams III M.D   On: 06/09/2019 00:51    EKG: Independently reviewed.  Twelve-lead EKG showed normal sinus rhythm normal axis normal intervals no T wave abnormalities.  Assessment/Plan Acute respiratory failure with hypoxia due to Pneumonia due to COVID-19 virus/sepsis He is requiring 4 L of oxygen to keep saturation greater than 94%. He continues to spike fevers, will check a  procalcitonin he did receive Rocephin and azithromycin will have low threshold to discontinue antibiotics, as he does not have leukocytosis. I will go ahead and start him on IV remdesivir and steroids, vitamin C and zinc. Check laboratory markers markers specially his D-dimer and his CRP, LDH and ferritin were obtained at [facility] and they were 324/1999 respectively. Due to his multiple risk factors and that he is requiring 4 L of oxygen I will go ahead and given convalescent plasma. The treatment plan and use of medications (convalescent plasma for COVID-19) and known side effects were discussed with patient/family, they were clearly explained that there is no proven definitive treatment for COVID-19 infection, any medications used here are based on published clinical articles/anecdotal data which are not peer-reviewed or randomized control trials.  Complete risks and long-term side effects are unknown, however in the best clinical judgment they seem to be of some clinical benefit rather than medical risks.  Patient/family agree with the treatment plan and want to receive the given medications. Use Tylenol for fevers.  Hypokalemia: Likely due to diarrhea and decreased oral intake. It was repleted orally now resolved.  Possible acute kidney injury: With an unknown baseline, will try and obtain records from his PCP.  He was previously taking ibuprofen for fevers. Hold losartan, he will get convalescent plasma which is a volume expander. Hold hydrochlorothiazide, ARB, ibuprofen and Metformin.  Diabetes mellitus type 2, uncontrolled (HCC) A1c start on sliding scale insulin. We will see how he responds to the steroids while being on Decadron. Hold metformin.  Essential hypertension: Blood pressure slightly elevated will hold diuretics and ARB due to possible acute kidney injury. Continue Norvasc use labetalol as needed for systolic blood pressure greater than 180.  Pleuritic chest pain  Now resolved likely due to pneumonia CT angio of the chest was negative for PE, but it did show Mild coronary artery calcifications in the LAD, Reactive nodes in the mediastinum.   DVT Prophylaxis: lovenox Code Status: full  Family Communication: none  Disposition Plan: unable to determine   Time spent: 70 minutes    It is my clinical opinion that admission to INPATIENT is reasonable and necessary in this 63 y.o. male no history of hypertension, morbidly obese and diabetes mellitus who comes in for shortness of breath hypoxic into the ED, was found to be Covid 19+ requiring 4 L of oxygen to keep saturations greater than 94%.  Started on IV Decadron and steroids and convalescent plasma.   Given the aforementioned, the predictability of an adverse outcome is felt to be significant. I expect that the patient will require at least 2 midnights in the hospital to treat this condition.  Marinda ElkAbraham Feliz Ortiz MD [facility]   06/09/2019, 2:36 PM

## 2019-06-09 NOTE — ED Notes (Signed)
Patient transported to CT 

## 2019-06-09 NOTE — Discharge Summary (Signed)
Physician Discharge Summary  Patient ID: James York MRN: [id number] DOB/AGE: Aug 23, 1955 63 y.o.  Admit date: 06/08/2019 Discharge date: 06/09/2019  Admission Diagnoses: Covid-19 PNA  Discharge Diagnoses:  Active Problems:   CAP (community acquired pneumonia)   Pneumonia due to COVID-19 virus   Discharged Condition: fair  Hospital Course:  HPI on admission:  James York  is a 63 y.o. obese African-American male with a known history of type 2 diabetes mellitus and hypertension, presented to the emergency room acute onset of dry cough for the last few days with associated chest pain mainly with cough or deep breathing and dyspnea.  He admitted to cold chills but has not checked his temperature.  He has been having watery bowel movements over the last 4 days.  He denied any loss of taste or smell.  He has been having generalized weakness but denied any body aches.  He has not had much appetite over the last 4 to 5 days.  No abdominal pain or melena or bright red bleeding per rectum.  No other bleeding diathesis.  No headache or dizziness or blurred vision. Upon presentation to the emergency room, temperature was 100 and pulse oximetry 95% and later 93% on room air with respiratory rate of 18 and later 37.  EKG showed no sinus rhythm with a rate of 80 with PVCs.  Labs revealed a troponin I 137 and later 104 and mild hypokalemia with potassium of three-point and a creatinine of 1.92 with a BUN of 18.  Two-view chest x-ray showed mild right basal atelectasis with questionable area of infiltrate versus pulmonary nodule or sixth rib sclerosis at the right lung base.  Chest CTA is currently pending.  The patient had a rapid COVID-19 antigen test that came back positive. He was given 4 mg IV morphine sulfate and 4 mg of IV Zofran twice, IV Zithromax and 500 mill of IV normal saline.  He will be admitted to a medically monitored bed for further evaluation and management.  1.  Covid-19  left-sided community-acquired pneumonia.  Patient will be admitted to a medical monitored bed.  He will be continued on antibiotic therapy with IV Rocephin and Zithromax.  Mucolytic therapy will be provided.  Sputum Gram stain and culture and sensitivity will be obtained and will follow blood cultures.  Will obtain pneumonia antigens.  He will be placed on isolation.  Will obtain inflammatory markers.  Should they come back elevated we will start him on Decadron.   -CT angio ruled out any PE even though they had limited study.  Confirmed typical pneumonia secondary to COVID-19 infection. -December has not been started -Patient was desatting on room air therefore supplemental oxygen was startd.  As of this morning quired   4 L to maintain the saturation.  2.  Hypokalemia.  Potassium will be placed in magnesium level will be checked.  3.  Chest pain, likely pleuritic with elevated troponin I.  -This pain resolved while in the ED -Peak troponin troponin started trending down - This could be related to demand ischemia secondary to #1.  - 2D echo pending - cardiology consulted by Dr. Juliann Pares and sent nurses this morning again; formal recs to be followed  4.  Acute kidney injury.   - IV normal saline  - His Hyzaar as well as Metformin will be held off.  5.  Type 2 diabetes mellitus. - placed on supplement coverage with NovoLog.  We will hold off his metformin.  We will continue glipizide with  holding parameters.  6.  Hypertension.  We will continue his amlodipine and hold off his Hyzaar.  7.  DVT prophylaxis.  Subcutaneous Lovenox  Consults: cardiology  Significant Diagnostic Studies: CT angio  Treatments: As above  Discharge Exam: Blood pressure 134/68, pulse 94, temperature (!) 101.4 F (38.6 C), temperature source Oral, resp. rate (!) 22, height 6\' 1"  (1.854 m), weight (!) 137.3 kg, SpO2 96 %.  PEx from 06/08/2019 admission:   GENERAL: No acute distress EYES: Pupils equal,  round, reactive to light and accommodation. No scleral icterus. Extraocular muscles intact.  HEENT: Head atraumatic, normocephalic. Oropharynx and nasopharynx clear.  NECK:  Supple, no jugular venous distention. No thyroid enlargement, no tenderness.  LUNGS: Normal breath sounds bilaterally, no wheezing, rales,rhonchi or crepitation. No use of accessory muscles of respiration.  CARDIOVASCULAR: Regular rate and rhythm, S1, S2 normal. No murmurs, rubs, or gallops.  ABDOMEN: Soft, nondistended, nontender. Bowel sounds present. No organomegaly or mass.  EXTREMITIES: No pedal edema, cyanosis, or clubbing.  NEUROLOGIC: Cranial nerves II through XII are intact. Muscle strength 5/5 in all extremities. Sensation intact. Gait not checked.  PSYCHIATRIC: The patient is alert and oriented x 3   Disposition: Discharge disposition: 02-Transferred to [facility]     To be transferred to [facility] for specialized care for Covid pneumonia.  I spoke to patient on the phone and he understood and agreed with the plan.   Discharge Instructions    Diet - low sodium heart healthy   Complete by: As directed      Allergies as of 06/09/2019   No Known Allergies     Medication List    TAKE these medications   amLODipine 10 MG tablet Commonly known as: NORVASC Take 10 mg by mouth daily.   glipiZIDE 5 MG 24 hr tablet Commonly known as: GLUCOTROL XL Take 5 mg by mouth daily.   hydrochlorothiazide 12.5 MG capsule Commonly known as: MICROZIDE Take 12.5 mg by mouth daily.   ibuprofen 600 MG tablet Commonly known as: ADVIL Take 600 mg by mouth 2 (two) times daily as needed.   losartan 50 MG tablet Commonly known as: COZAAR Take 50 mg by mouth daily.   metFORMIN 750 MG 24 hr tablet Commonly known as: GLUCOPHAGE-XR Take 750 mg by mouth daily.   terbinafine 250 MG tablet Commonly known as: LAMISIL Take 250 mg by mouth daily.        Signed: Thornell Mule 06/09/2019, 1:21  PM

## 2019-06-09 NOTE — Progress Notes (Signed)
Report given to Kahi Mohala RN at [facility]. [facility] is on the way and will be transported to room 153.

## 2019-06-10 LAB — CBC WITH DIFFERENTIAL/PLATELET
Abs Immature Granulocytes: 0.01 10*3/uL (ref 0.00–0.07)
Basophils Absolute: 0 10*3/uL (ref 0.0–0.1)
Basophils Relative: 0 %
Eosinophils Absolute: 0 10*3/uL (ref 0.0–0.5)
Eosinophils Relative: 0 %
HCT: 33.1 % — ABNORMAL LOW (ref 39.0–52.0)
Hemoglobin: 10.9 g/dL — ABNORMAL LOW (ref 13.0–17.0)
Immature Granulocytes: 0 %
Lymphocytes Relative: 24 %
Lymphs Abs: 0.6 10*3/uL — ABNORMAL LOW (ref 0.7–4.0)
MCH: 28.5 pg (ref 26.0–34.0)
MCHC: 32.9 g/dL (ref 30.0–36.0)
MCV: 86.6 fL (ref 80.0–100.0)
Monocytes Absolute: 0.1 10*3/uL (ref 0.1–1.0)
Monocytes Relative: 5 %
Neutro Abs: 1.9 10*3/uL (ref 1.7–7.7)
Neutrophils Relative %: 71 %
Platelets: 163 10*3/uL (ref 150–400)
RBC: 3.82 MIL/uL — ABNORMAL LOW (ref 4.22–5.81)
RDW: 13.1 % (ref 11.5–15.5)
WBC: 2.6 10*3/uL — ABNORMAL LOW (ref 4.0–10.5)
nRBC: 0 % (ref 0.0–0.2)

## 2019-06-10 LAB — GLUCOSE, CAPILLARY
Glucose-Capillary: 215 mg/dL — ABNORMAL HIGH (ref 70–99)
Glucose-Capillary: 191 mg/dL — ABNORMAL HIGH (ref 70–99)

## 2019-06-10 LAB — COMPREHENSIVE METABOLIC PANEL
ALT: 29 U/L (ref 0–44)
AST: 50 U/L — ABNORMAL HIGH (ref 15–41)
Albumin: 3.3 g/dL — ABNORMAL LOW (ref 3.5–5.0)
Alkaline Phosphatase: 50 U/L (ref 38–126)
Anion gap: 14 (ref 5–15)
BUN: 21 mg/dL (ref 8–23)
CO2: 20 mmol/L — ABNORMAL LOW (ref 22–32)
Calcium: 8 mg/dL — ABNORMAL LOW (ref 8.9–10.3)
Chloride: 101 mmol/L (ref 98–111)
Creatinine, Ser: 2.09 mg/dL — ABNORMAL HIGH (ref 0.61–1.24)
GFR calc Af Amer: 38 mL/min — ABNORMAL LOW (ref >60)
GFR calc non Af Amer: 33 mL/min — ABNORMAL LOW (ref >60)
Glucose, Bld: 272 mg/dL — ABNORMAL HIGH (ref 70–99)
Potassium: 4.1 mmol/L (ref 3.5–5.1)
Sodium: 135 mmol/L (ref 135–145)
Total Bilirubin: 0.6 mg/dL (ref 0.3–1.2)
Total Protein: 7 g/dL (ref 6.5–8.1)

## 2019-06-10 LAB — C-REACTIVE PROTEIN: CRP: 18.6 mg/dL — ABNORMAL HIGH (ref <1.0)

## 2019-06-10 LAB — ABO/RH: ABO/RH(D): A POS

## 2019-06-10 LAB — MAGNESIUM: Magnesium: 1.8 mg/dL (ref 1.7–2.4)

## 2019-06-10 LAB — D-DIMER, QUANTITATIVE: D-Dimer, Quant: 0.89 ug/mL-FEU — ABNORMAL HIGH (ref 0.00–0.50)

## 2019-06-10 MED ORDER — DIPHENOXYLATE-ATROPINE 2.5-0.025 MG/5ML PO LIQD: 10.0000 mL | Freq: Four times a day (QID) | ORAL | Status: DC | PRN

## 2019-06-10 MED ORDER — GUAIFENESIN-DM 100-10 MG/5ML PO SYRP
10.0000 mL | ORAL_SOLUTION | ORAL | Status: DC | PRN
  Administered 2019-06-10 – 2019-06-12 (×8): 10 mL via ORAL
  Filled 2019-06-10 (×8): qty 10

## 2019-06-10 MED ORDER — GUAIFENESIN-DM 100-10 MG/5ML PO SYRP
5.0000 mL | ORAL_SOLUTION | ORAL | Status: DC | PRN
  Administered 2019-06-10: 5 mL via ORAL
  Filled 2019-06-10: qty 10

## 2019-06-10 MED ORDER — INSULIN DETEMIR 100 UNIT/ML ~~LOC~~ SOLN
20.0000 [IU] | Freq: Two times a day (BID) | SUBCUTANEOUS | Status: DC
  Administered 2019-06-10 (×2): 20 [IU] via SUBCUTANEOUS
  Filled 2019-06-10 (×3): qty 0.2

## 2019-06-10 MED ORDER — DIPHENOXYLATE-ATROPINE 2.5-0.025 MG PO TABS
2.0000 | ORAL_TABLET | Freq: Four times a day (QID) | ORAL | Status: DC | PRN
  Administered 2019-06-10: 2 via ORAL
  Filled 2019-06-10 (×2): qty 2

## 2019-06-10 NOTE — Progress Notes (Signed)
Patient deferred family update phone call at this time.

## 2019-06-10 NOTE — Plan of Care (Signed)
Pt using incentive spirometer

## 2019-06-10 NOTE — Progress Notes (Signed)
[facility] PROGRESS NOTE    Progress Note  James York  WUJ:[id number] DOB: 06/13/1956 DOA: 06/09/2019 PCP: Patient, [name]     Brief Narrative:   James York is an 63 y.o. male past medical history of essential hypertension, diabetes mellitus type 2 with a normal hemoglobin A1c comes into the ED for persistent cough chest pain shortness of breath that started 4 days prior to admission.  Assessment/Plan:   Sepsis/acute respiratory failure with hypoxia due to Pneumonia due to COVID-19 virus He has now stopped spiking fevers. He is requiring 4 L of oxygen to keep saturations greater than 89%, he is comfortable laying in bed in no apparent acute distress, he relates his breathing is better than yesterday. On IV remdesivir and steroids, vitamin C and zinc. His inflammatory markers are worsening, versus respiration and his oxygen requirements have been the same. We will keep the patient peripherally 16 hours a day, if no pronator bed to chair.  Hypokalemia: Resolved with oral repletion.  Possible acute kidney injury: With an unknown baseline  Diabetes mellitus type 2, uncontrolled (HCC): Oral hypoglycemic agents were held on admission, his A1c 7.4. He was started on long-acting insulin plus sliding scale his blood glucose trending up. Increase long-acting insulin and change sliding scale to moderate scale.  Essential hypertension Stable controlled.  Pleuritic chest pain Now resolved work-up has been negative unlikely ACS.   DVT prophylaxis: Eliquis Family Communication:none Disposition Plan/Barrier to D/C: home in 3-4 days Code Status:     Code Status Orders  (From admission, onward)         Start     Ordered   06/09/19 1357  Full code  Continuous     06/09/19 1400        Code Status History    Date Active Date Inactive Code Status Order ID Comments User Context   06/08/2019 2223 06/09/2019 1347 Full Code [id number]  Mansy, Vernetta Honey, MD ED   Advance Care Planning Activity        IV Access:    Peripheral IV   Procedures and diagnostic studies:   Dg Chest 2 View  Result Date: 06/08/2019 CLINICAL DATA:  Truck driver with weakness, diarrhea, tired and sleeping all the time, onset of symptoms 4-5 days ago, chest pain and shortness of breath a couple days ago with radiation to shoulder but no pain at this time EXAM: CHEST - 2 VIEW COMPARISON:  None FINDINGS: Upper normal size of cardiac silhouette. Mediastinal contours and pulmonary vascularity normal. Mild RIGHT basilar atelectasis. A more focal area of nodular opacity 2.4 x 1.4 cm projects over the anterior RIGHT sixth rib, uncertain if represents superimposed infiltrate, pulmonary nodule or sclerosis within the anterior RIGHT sixth rib. Remaining lungs clear. No pleural effusion or pneumothorax. No additional osseous findings. IMPRESSION: Mild RIGHT basilar atelectasis with questionable area of infiltrate versus pulmonary nodule or sixth rib sclerosis at RIGHT lung base. Either radiographic follow-up until resolution or CT chest recommended to exclude pulmonary nodule. Electronically Signed   By: Ulyses Southward M.D.   On: 06/08/2019 19:02   Ct Angio Chest Pe W And/or Wo Contrast  Result Date: 06/09/2019 CLINICAL DATA:  Positive COVID-19 test. Chest pain and shortness of breath. EXAM: CT ANGIOGRAPHY CHEST WITH CONTRAST TECHNIQUE: Multidetector CT imaging of the chest was performed using the standard protocol during bolus administration of intravenous contrast. Multiplanar CT image reconstructions and MIPs were obtained to evaluate the vascular anatomy. CONTRAST:  75mL OMNIPAQUE IOHEXOL 350 MG/ML  SOLN COMPARISON:  Chest x-ray June 08, 2019 FINDINGS: Cardiovascular: The heart is normal in size. Mild coronary artery calcifications are seen in the LAD. The thoracic aorta is normal. Central pulmonary arteries are normal in caliber. Evaluation for pulmonary emboli is somewhat limited due to  respiratory motion and stairstep artifact. Within these limitations, no pulmonary emboli are identified. Mediastinum/Nodes: Shotty reactive nodes in the mediastinum. No effusions. The thyroid and esophagus are normal. There is a small pericardial effusion measuring 7 mm in thickness. Lungs/Pleura: Central airways are normal. No pneumothorax. Bilateral pulmonary infiltrates involving all lobes are consistent with the patient's COVID-19 diagnosis. Upper Abdomen: No acute abnormality. Musculoskeletal: No chest wall abnormality. No acute or significant osseous findings. Review of the MIP images confirms the above findings. IMPRESSION: 1. Bilateral pulmonary infiltrates consistent with the patient's COVID-19 diagnosis. 2. Evaluation for pulmonary emboli is somewhat limited due to respiratory motion and stairstep artifact. However, within these limitations, no emboli are seen. 3. Mild coronary artery calcifications in the LAD. 4. Reactive nodes in the mediastinum. Electronically Signed   By: Dorise Bullion III M.D   On: 06/09/2019 00:51     Medical Consultants:    None.  Anti-Infectives:   IV remdesivir  Subjective:    James York relates his breathing is significantly better than yesterday.  Objective:    Vitals:   06/09/19 2254 06/09/19 2310 06/10/19 0336 06/10/19 0520  BP: (!) 100/57 (!) 99/52 132/83 123/65  Pulse: 67 66 75 67  Resp: 20 20 20    Temp: 98.6 F (37 C) 98.3 F (36.8 C) 98 F (36.7 C)   TempSrc: Oral Oral Oral   SpO2: 92% 92% 96% (!) 89%   SpO2: (!) 89 % O2 Flow Rate (L/min): 4 L/min   Intake/Output Summary (Last 24 hours) at 06/10/2019 0745 Last data filed at 06/10/2019 0336 Gross per 24 hour  Intake 539 ml  Output 275 ml  Net 264 ml   There were no vitals filed for this visit.  Exam: General exam: In no acute distress. Respiratory system: Good air movement and crackles predominantly in the right lung base Cardiovascular system: S1 & S2 heard, RRR. No  JVD. Gastrointestinal system: Abdomen is nondistended, soft and nontender.  Central nervous system: Alert and oriented. No focal neurological deficits. Extremities: No pedal edema. Skin: No rashes, lesions or ulcers Psychiatry: Judgement and insight appear normal. Mood & affect appropriate.   Data Reviewed:    Labs: Basic Metabolic Panel: Recent Labs  Lab 06/08/19 1810 06/08/19 2048 06/09/19 0411 06/10/19 0420  NA 137  --  136 135  K 3.2*  --  3.9 4.1  CL 105  --  103 101  CO2 22  --  24 20*  GLUCOSE 129*  --  110* 272*  BUN 18  --  17 21  CREATININE 1.94*  --  1.82* 2.09*  CALCIUM 8.2*  --  7.6* 8.0*  MG  --  1.9  --  1.8   GFR Estimated Creatinine Clearance: 53.3 mL/min (A) (by C-G formula based on SCr of 2.09 mg/dL (H)). Liver Function Tests: Recent Labs  Lab 06/09/19 1435 06/10/19 0420  AST 50* 50*  ALT 27 29  ALKPHOS 50 50  BILITOT 0.6 0.6  PROT 6.4* 7.0  ALBUMIN 2.9* 3.3*   No results for input(s): LIPASE, AMYLASE in the last 168 hours. No results for input(s): AMMONIA in the last 168 hours. Coagulation profile No results for input(s): INR, PROTIME in the last 168 hours.  COVID-19 Labs  Recent Labs    06/09/19 0003 06/09/19 1435 06/10/19 0420  DDIMER  --  1.04* 0.89*  FERRITIN 499*  --   --   LDH 320*  --   --   CRP 13.2* 16.1* 18.6*    Lab Results  Component Value Date   SARSCOV2NAA POSITIVE (A) 06/08/2019    CBC: Recent Labs  Lab 06/08/19 1810 06/09/19 0411 06/09/19 1435 06/10/19 0420  WBC 4.0 4.5 3.6* 2.6*  NEUTROABS  --   --   --  1.9  HGB 11.7* 10.7* 10.3* 10.9*  HCT 33.5* 33.2* 31.4* 33.1*  MCV 82.7 87.4 87.7 86.6  PLT 169 158 158 163   Cardiac Enzymes: No results for input(s): CKTOTAL, CKMB, CKMBINDEX, TROPONINI in the last 168 hours. BNP (last 3 results) No results for input(s): PROBNP in the last 8760 hours. CBG: Recent Labs  Lab 06/08/19 1809 06/09/19 0838 06/09/19 1203 06/09/19 1647 06/09/19 2139  GLUCAP 129* 78  105* 97 187*   D-Dimer: Recent Labs    06/09/19 1435 06/10/19 0420  DDIMER 1.04* 0.89*   Hgb A1c: Recent Labs    06/08/19 2308  HGBA1C 7.4*   Lipid Profile: No results for input(s): CHOL, HDL, LDLCALC, TRIG, CHOLHDL, LDLDIRECT in the last 72 hours. Thyroid function studies: No results for input(s): TSH, T4TOTAL, T3FREE, THYROIDAB in the last 72 hours.  Invalid input(s): FREET3 Anemia work up: Recent Labs    06/09/19 0003  FERRITIN 499*   Sepsis Labs: Recent Labs  Lab 06/08/19 1810 06/09/19 0411 06/09/19 1435 06/10/19 0420  PROCALCITON  --   --  0.12  --   WBC 4.0 4.5 3.6* 2.6*   Microbiology Recent Results (from the past 240 hour(s))  SARS CORONAVIRUS 2 (TAT 6-24 HRS) Nasopharyngeal Nasopharyngeal Swab     Status: Abnormal   Collection Time: 06/08/19  9:30 PM   Specimen: Nasopharyngeal Swab  Result Value Ref Range Status   SARS Coronavirus 2 POSITIVE (A) NEGATIVE Final    Comment: RESULT CALLED TO, READ BACK BY AND VERIFIED WITH: MMariel Aloe 0326 06/09/2019 T. TYSOR (NOTE) SARS-CoV-2 target nucleic acids are DETECTED. The SARS-CoV-2 RNA is generally detectable in upper and lower respiratory specimens during the acute phase of infection. Positive results are indicative of the presence of SARS-CoV-2 RNA. Clinical correlation with patient history and other diagnostic information is  necessary to determine patient infection status. Positive results do not rule out bacterial infection or co-infection with other viruses.  The expected result is Negative. Fact Sheet for Patients: HairSlick.no Fact Sheet for Healthcare Providers: quierodirigir.com This test is not yet approved or cleared by the [place] FDA and  has been authorized for detection and/or diagnosis of SARS-CoV-2 by FDA under an Emergency Use Authorization (EUA). This EUA will remain  in effect (meaning this test can be used) for th e  duration of the COVID-19 declaration under Section 564(b)(1) of the Act, 21 U.S.C. section 360bbb-3(b)(1), unless the authorization is terminated or revoked sooner. Performed at [facility], [address], [place], [place] [address]   Blood culture (routine x 2)     Status: None (Preliminary result)   Collection Time: 06/08/19 11:09 PM   Specimen: BLOOD  Result Value Ref Range Status   Specimen Description   Final    BLOOD Blood Culture results may not be optimal due to an excessive volume of blood received in culture bottles   Special Requests   Final    BOTTLES DRAWN  AEROBIC AND ANAEROBIC BLOOD LEFT HAND   Culture   Final    NO GROWTH < 12 HOURS Performed at [facility], [address] [place], [place] [address]    Report Status PENDING  Incomplete  Blood culture (routine x 2)     Status: None (Preliminary result)   Collection Time: 06/08/19 11:09 PM   Specimen: BLOOD  Result Value Ref Range Status   Specimen Description BLOOD Blood Culture adequate volume  Final   Special Requests BOTTLES DRAWN AEROBIC AND ANAEROBIC BLOOD LEFT ARM  Final   Culture   Final    NO GROWTH < 12 HOURS Performed at [facility], [address] [place], [place] [address]    Report Status PENDING  Incomplete     Medications:    sodium chloride   Intravenous Once   amLODipine  10 mg Oral Daily   dexamethasone (DECADRON) injection  6 mg Intravenous Q12H   enoxaparin (LOVENOX) injection  70 mg Subcutaneous Q24H   insulin aspart  0-20 Units Subcutaneous TID WC   insulin aspart  0-5 Units Subcutaneous QHS   insulin aspart  6 Units Subcutaneous TID WC   insulin detemir  10 Units Subcutaneous BID   sodium chloride flush  3 mL Intravenous Q12H   Continuous Infusions:  sodium chloride     remdesivir 100 mg in NS 100 mL      LOS: 1 day   Marinda ElkAbraham Feliz Ortiz  [facility]  06/10/2019, 7:45 AM

## 2019-06-11 ENCOUNTER — Encounter (HOSPITAL_COMMUNITY): Payer: Self-pay

## 2019-06-11 LAB — COMPREHENSIVE METABOLIC PANEL
ALT: 26 U/L (ref 0–44)
AST: 39 U/L (ref 15–41)
Albumin: 2.9 g/dL — ABNORMAL LOW (ref 3.5–5.0)
Alkaline Phosphatase: 44 U/L (ref 38–126)
Anion gap: 11 (ref 5–15)
BUN: 33 mg/dL — ABNORMAL HIGH (ref 8–23)
CO2: 26 mmol/L (ref 22–32)
Calcium: 8.3 mg/dL — ABNORMAL LOW (ref 8.9–10.3)
Chloride: 102 mmol/L (ref 98–111)
Creatinine, Ser: 2.05 mg/dL — ABNORMAL HIGH (ref 0.61–1.24)
GFR calc Af Amer: 39 mL/min — ABNORMAL LOW (ref >60)
GFR calc non Af Amer: 34 mL/min — ABNORMAL LOW (ref >60)
Glucose, Bld: 235 mg/dL — ABNORMAL HIGH (ref 70–99)
Potassium: 4.7 mmol/L (ref 3.5–5.1)
Sodium: 139 mmol/L (ref 135–145)
Total Bilirubin: 0.6 mg/dL (ref 0.3–1.2)
Total Protein: 6.5 g/dL (ref 6.5–8.1)

## 2019-06-11 LAB — CBC WITH DIFFERENTIAL/PLATELET
Abs Immature Granulocytes: 0.02 10*3/uL (ref 0.00–0.07)
Basophils Absolute: 0 10*3/uL (ref 0.0–0.1)
Basophils Relative: 0 %
Eosinophils Absolute: 0 10*3/uL (ref 0.0–0.5)
Eosinophils Relative: 0 %
HCT: 32.3 % — ABNORMAL LOW (ref 39.0–52.0)
Hemoglobin: 10.6 g/dL — ABNORMAL LOW (ref 13.0–17.0)
Immature Granulocytes: 0 %
Lymphocytes Relative: 20 %
Lymphs Abs: 0.9 10*3/uL (ref 0.7–4.0)
MCH: 28.8 pg (ref 26.0–34.0)
MCHC: 32.8 g/dL (ref 30.0–36.0)
MCV: 87.8 fL (ref 80.0–100.0)
Monocytes Absolute: 0.3 10*3/uL (ref 0.1–1.0)
Monocytes Relative: 7 %
Neutro Abs: 3.3 10*3/uL (ref 1.7–7.7)
Neutrophils Relative %: 73 %
Platelets: 201 10*3/uL (ref 150–400)
RBC: 3.68 MIL/uL — ABNORMAL LOW (ref 4.22–5.81)
RDW: 13.2 % (ref 11.5–15.5)
WBC: 4.6 10*3/uL (ref 4.0–10.5)
nRBC: 0 % (ref 0.0–0.2)

## 2019-06-11 LAB — C-REACTIVE PROTEIN: CRP: 11.3 mg/dL — ABNORMAL HIGH (ref <1.0)

## 2019-06-11 LAB — PREPARE FRESH FROZEN PLASMA: Unit division: 0

## 2019-06-11 LAB — BPAM FFP
Blood Product Expiration Date: 202012061817
ISSUE DATE / TIME: 202012052117
Unit Type and Rh: 6200

## 2019-06-11 LAB — GLUCOSE, CAPILLARY
Glucose-Capillary: 224 mg/dL — ABNORMAL HIGH (ref 70–99)
Glucose-Capillary: 261 mg/dL — ABNORMAL HIGH (ref 70–99)
Glucose-Capillary: 206 mg/dL — ABNORMAL HIGH (ref 70–99)
Glucose-Capillary: 175 mg/dL — ABNORMAL HIGH (ref 70–99)
Glucose-Capillary: 214 mg/dL — ABNORMAL HIGH (ref 70–99)

## 2019-06-11 LAB — D-DIMER, QUANTITATIVE: D-Dimer, Quant: 0.64 ug/mL-FEU — ABNORMAL HIGH (ref 0.00–0.50)

## 2019-06-11 LAB — MAGNESIUM: Magnesium: 2.1 mg/dL (ref 1.7–2.4)

## 2019-06-11 LAB — LEGIONELLA PNEUMOPHILA SEROGP 1 UR AG: L. pneumophila Serogp 1 Ur Ag: NEGATIVE

## 2019-06-11 MED ORDER — INSULIN DETEMIR 100 UNIT/ML ~~LOC~~ SOLN
28.0000 [IU] | Freq: Two times a day (BID) | SUBCUTANEOUS | Status: DC
  Administered 2019-06-11: 28 [IU] via SUBCUTANEOUS
  Filled 2019-06-11 (×2): qty 0.28

## 2019-06-11 MED ORDER — TOCILIZUMAB 400 MG/20ML IV SOLN
800.0000 mg | Freq: Once | INTRAVENOUS | Status: AC
  Administered 2019-06-11: 800 mg via INTRAVENOUS
  Filled 2019-06-11: qty 40

## 2019-06-11 MED ORDER — INSULIN DETEMIR 100 UNIT/ML ~~LOC~~ SOLN
25.0000 [IU] | Freq: Two times a day (BID) | SUBCUTANEOUS | Status: DC
  Administered 2019-06-11: 09:00:00 25 [IU] via SUBCUTANEOUS
  Filled 2019-06-11: qty 0.25

## 2019-06-11 NOTE — Progress Notes (Addendum)
[facility] PROGRESS NOTE    Progress Note  James York  WUJ:[id number] DOB: April 10, 1956 DOA: 06/09/2019 PCP: Patient, [name]     Brief Narrative:   James York is an 63 y.o. male past medical history of essential hypertension, diabetes mellitus type 2 with a normal hemoglobin A1c comes into the ED for persistent cough chest pain shortness of breath that started 4 days prior to admission.  Assessment/Plan:   Sepsis/acute respiratory failure with hypoxia due to Pneumonia due to COVID-19 virus: Sepsis has been ruled in. Has remained afebrile, currently requiring 7 L of oxygen to keep saturations greater 91%. Continue IV remdesivir, steroids, vitamin C and zinc. His inflammatory markers are slowly improving and  he is requiring more oxygen today. His procalcitonin was low yield. He received fresh frozen plasma on 06/09/2019 He has agreed to Actemra. The treatment plan and use of medications and known side effects were discussed with patient/family, they were clearly explained that there is no proven definitive treatment for COVID-19 infection, any medications used here are based on published clinical articles/anecdotal data which are not peer-reviewed or randomized control trials.  Complete risks and long-term side effects are unknown, however in the best clinical judgment they seem to be of some clinical benefit rather than medical risks.  Patient/family agree with the treatment plan and want to receive the given medications. Try to keep the patient prone for early 16 hours a day if not prone out of bed to chair.  Hypokalemia: Resolved with oral repletion.  Chronic renal disease IIIB: He has remained at baseline.  Diabetes mellitus type 2, uncontrolled (HCC): With an A1c of 7.4, his blood glucose still elevated increase long-acting insulin. Contnue sliding scale.  Essential hypertension Stable controlled.  Pleuritic chest pain Now resolved work-up has been  negative unlikely ACS.   DVT prophylaxis: Eliquis Family Communication:none Disposition Plan/Barrier to D/C: Unable to determine. Code Status:     Code Status Orders  (From admission, onward)         Start     Ordered   06/09/19 1357  Full code  Continuous     06/09/19 1400        Code Status History    Date Active Date Inactive Code Status Order ID Comments User Context   06/08/2019 2223 06/09/2019 1347 Full Code [id number]  Mansy, Vernetta Honey, MD ED   Advance Care Planning Activity        IV Access:    Peripheral IV   Procedures and diagnostic studies:   No results found.   Medical Consultants:    None.  Anti-Infectives:   IV remdesivir  Subjective:    James York he relates his breathing is worse today especially when he ambulates.  Objective:    Vitals:   06/11/19 0519 06/11/19 0641 06/11/19 0658 06/11/19 0729  BP: 123/81   (!) 98/34  Pulse: 71 64  69  Resp: 20   20  Temp: (!) 97.5 F (36.4 C)   98.5 F (36.9 C)  TempSrc: Oral   Oral  SpO2: 92% (!) 85%  91%  Weight:   (!) 137.3 kg   Height:   6\' 1"  (1.854 m)    SpO2: 91 % O2 Flow Rate (L/min): 7 L/min   Intake/Output Summary (Last 24 hours) at 06/11/2019 0739 Last data filed at 06/11/2019 0600 Gross per 24 hour  Intake 100 ml  Output 700 ml  Net -600 ml   Filed Weights   06/11/19 14/07/20  Weight: (!) 137.3 kg    Exam: General exam: In no acute distress. Respiratory system: Good air movement and diffuse crackles bilaterally. Cardiovascular system: S1 & S2 heard, RRR.  No appreciated JVD. Gastrointestinal system: Abdomen is nondistended, soft and nontender.  Central nervous system: Alert and oriented. No focal neurological deficits. Extremities: No pedal edema. Skin: No rashes, lesions or ulcers Psychiatry: Judgement and insight appear normal. Mood & affect appropriate.     Data Reviewed:    Labs: Basic Metabolic Panel: Recent Labs  Lab 06/08/19 1810 06/08/19 2048  06/09/19 0411 06/10/19 0420 06/11/19 0418  NA 137  --  136 135 139  K 3.2*  --  3.9 4.1 4.7  CL 105  --  103 101 102  CO2 22  --  24 20* 26  GLUCOSE 129*  --  110* 272* 235*  BUN 18  --  17 21 33*  CREATININE 1.94*  --  1.82* 2.09* 2.05*  CALCIUM 8.2*  --  7.6* 8.0* 8.3*  MG  --  1.9  --  1.8 2.1   GFR Estimated Creatinine Clearance: 54.4 mL/min (A) (by C-G formula based on SCr of 2.05 mg/dL (H)). Liver Function Tests: Recent Labs  Lab 06/09/19 1435 06/10/19 0420 06/11/19 0418  AST 50* 50* 39  ALT 27 29 26   ALKPHOS 50 50 44  BILITOT 0.6 0.6 0.6  PROT 6.4* 7.0 6.5  ALBUMIN 2.9* 3.3* 2.9*   No results for input(s): LIPASE, AMYLASE in the last 168 hours. No results for input(s): AMMONIA in the last 168 hours. Coagulation profile No results for input(s): INR, PROTIME in the last 168 hours. COVID-19 Labs  Recent Labs    06/09/19 0003 06/09/19 1435 06/10/19 0420 06/11/19 0418  DDIMER  --  1.04* 0.89*  --   FERRITIN 499*  --   --   --   LDH 320*  --   --   --   CRP 13.2* 16.1* 18.6* 11.3*    Lab Results  Component Value Date   SARSCOV2NAA POSITIVE (A) 06/08/2019    CBC: Recent Labs  Lab 06/08/19 1810 06/09/19 0411 06/09/19 1435 06/10/19 0420 06/11/19 0418  WBC 4.0 4.5 3.6* 2.6* 4.6  NEUTROABS  --   --   --  1.9 3.3  HGB 11.7* 10.7* 10.3* 10.9* 10.6*  HCT 33.5* 33.2* 31.4* 33.1* 32.3*  MCV 82.7 87.4 87.7 86.6 87.8  PLT 169 158 158 163 201   Cardiac Enzymes: No results for input(s): CKTOTAL, CKMB, CKMBINDEX, TROPONINI in the last 168 hours. BNP (last 3 results) No results for input(s): PROBNP in the last 8760 hours. CBG: Recent Labs  Lab 06/09/19 1203 06/09/19 1647 06/09/19 2139 06/10/19 0854 06/10/19 1617  GLUCAP 105* 97 187* 215* 191*   D-Dimer: Recent Labs    06/09/19 1435 06/10/19 0420  DDIMER 1.04* 0.89*   Hgb A1c: Recent Labs    06/08/19 2308  HGBA1C 7.4*   Lipid Profile: No results for input(s): CHOL, HDL, LDLCALC, TRIG,  CHOLHDL, LDLDIRECT in the last 72 hours. Thyroid function studies: No results for input(s): TSH, T4TOTAL, T3FREE, THYROIDAB in the last 72 hours.  Invalid input(s): FREET3 Anemia work up: Recent Labs    06/09/19 0003  FERRITIN 499*   Sepsis Labs: Recent Labs  Lab 06/09/19 0411 06/09/19 1435 06/10/19 0420 06/11/19 0418  PROCALCITON  --  0.12  --   --   WBC 4.5 3.6* 2.6* 4.6   Microbiology Recent Results (from the past 240 hour(s))  SARS CORONAVIRUS 2 (TAT 6-24 HRS) Nasopharyngeal Nasopharyngeal Swab     Status: Abnormal   Collection Time: 06/08/19  9:30 PM   Specimen: Nasopharyngeal Swab  Result Value Ref Range Status   SARS Coronavirus 2 POSITIVE (A) NEGATIVE Final    Comment: RESULT CALLED TO, READ BACK BY AND VERIFIED WITH: M. Illene Regulus 0326 06/09/2019 T. TYSOR (NOTE) SARS-CoV-2 target nucleic acids are DETECTED. The SARS-CoV-2 RNA is generally detectable in upper and lower respiratory specimens during the acute phase of infection. Positive results are indicative of the presence of SARS-CoV-2 RNA. Clinical correlation with patient history and other diagnostic information is  necessary to determine patient infection status. Positive results do not rule out bacterial infection or co-infection with other viruses.  The expected result is Negative. Fact Sheet for Patients: SugarRoll.be Fact Sheet for Healthcare Providers: https://www.woods-mathews.com/ This test is not yet approved or cleared by the [place] FDA and  has been authorized for detection and/or diagnosis of SARS-CoV-2 by FDA under an Emergency Use Authorization (EUA). This EUA will remain  in effect (meaning this test can be used) for th e duration of the COVID-19 declaration under Section 564(b)(1) of the Act, 21 U.S.C. section 360bbb-3(b)(1), unless the authorization is terminated or revoked sooner. Performed at [facility], [place] [address],  [place], [place] [address]   Blood culture (routine x 2)     Status: None (Preliminary result)   Collection Time: 06/08/19 11:09 PM   Specimen: BLOOD  Result Value Ref Range Status   Specimen Description   Final    BLOOD Blood Culture results may not be optimal due to an excessive volume of blood received in culture bottles   Special Requests   Final    BOTTLES DRAWN AEROBIC AND ANAEROBIC BLOOD LEFT HAND   Culture   Final    NO GROWTH 2 DAYS Performed at [facility], [address], [place], [place] [address]    Report Status PENDING  Incomplete  Blood culture (routine x 2)     Status: None (Preliminary result)   Collection Time: 06/08/19 11:09 PM   Specimen: BLOOD  Result Value Ref Range Status   Specimen Description BLOOD Blood Culture adequate volume  Final   Special Requests BOTTLES DRAWN AEROBIC AND ANAEROBIC BLOOD LEFT ARM  Final   Culture   Final    NO GROWTH 2 DAYS Performed at [facility], [address] [place], [place] [address]    Report Status PENDING  Incomplete     Medications:   . sodium chloride   Intravenous Once  . amLODipine  10 mg Oral Daily  . dexamethasone (DECADRON) injection  6 mg Intravenous Q12H  . enoxaparin (LOVENOX) injection  70 mg Subcutaneous Q24H  . insulin aspart  0-20 Units Subcutaneous TID WC  . insulin aspart  0-5 Units Subcutaneous QHS  . insulin aspart  6 Units Subcutaneous TID WC  . insulin detemir  20 Units Subcutaneous BID  . sodium chloride flush  3 mL Intravenous Q12H   Continuous Infusions: . sodium chloride    . remdesivir 100 mg in NS 100 mL 100 mg (06/10/19 1109)    LOS: 2 days   Charlynne Cousins  [facility]  06/11/2019, 7:39 AM

## 2019-06-11 NOTE — Progress Notes (Signed)
Spoke with Sonia Side, the patient's brother and answered all questions at this time.

## 2019-06-12 LAB — CBC WITH DIFFERENTIAL/PLATELET
Abs Immature Granulocytes: 0.1 10*3/uL — ABNORMAL HIGH (ref 0.00–0.07)
Basophils Absolute: 0 10*3/uL (ref 0.0–0.1)
Basophils Relative: 0 %
Eosinophils Absolute: 0 10*3/uL (ref 0.0–0.5)
Eosinophils Relative: 0 %
HCT: 34.4 % — ABNORMAL LOW (ref 39.0–52.0)
Hemoglobin: 11.5 g/dL — ABNORMAL LOW (ref 13.0–17.0)
Immature Granulocytes: 1 %
Lymphocytes Relative: 14 %
Lymphs Abs: 1.3 10*3/uL (ref 0.7–4.0)
MCH: 28.8 pg (ref 26.0–34.0)
MCHC: 33.4 g/dL (ref 30.0–36.0)
MCV: 86.2 fL (ref 80.0–100.0)
Monocytes Absolute: 0.6 10*3/uL (ref 0.1–1.0)
Monocytes Relative: 6 %
Neutro Abs: 7.5 10*3/uL (ref 1.7–7.7)
Neutrophils Relative %: 79 %
Platelets: 274 10*3/uL (ref 150–400)
RBC: 3.99 MIL/uL — ABNORMAL LOW (ref 4.22–5.81)
RDW: 13.2 % (ref 11.5–15.5)
WBC: 9.6 10*3/uL (ref 4.0–10.5)
nRBC: 0.4 % — ABNORMAL HIGH (ref 0.0–0.2)

## 2019-06-12 LAB — COMPREHENSIVE METABOLIC PANEL
ALT: 34 U/L (ref 0–44)
AST: 52 U/L — ABNORMAL HIGH (ref 15–41)
Albumin: 2.9 g/dL — ABNORMAL LOW (ref 3.5–5.0)
Alkaline Phosphatase: 47 U/L (ref 38–126)
Anion gap: 11 (ref 5–15)
BUN: 30 mg/dL — ABNORMAL HIGH (ref 8–23)
CO2: 25 mmol/L (ref 22–32)
Calcium: 8.5 mg/dL — ABNORMAL LOW (ref 8.9–10.3)
Chloride: 105 mmol/L (ref 98–111)
Creatinine, Ser: 1.69 mg/dL — ABNORMAL HIGH (ref 0.61–1.24)
GFR calc Af Amer: 49 mL/min — ABNORMAL LOW (ref >60)
GFR calc non Af Amer: 43 mL/min — ABNORMAL LOW (ref >60)
Glucose, Bld: 148 mg/dL — ABNORMAL HIGH (ref 70–99)
Potassium: 4 mmol/L (ref 3.5–5.1)
Sodium: 141 mmol/L (ref 135–145)
Total Bilirubin: 0.4 mg/dL (ref 0.3–1.2)
Total Protein: 6.6 g/dL (ref 6.5–8.1)

## 2019-06-12 LAB — GLUCOSE, CAPILLARY
Glucose-Capillary: 199 mg/dL — ABNORMAL HIGH (ref 70–99)
Glucose-Capillary: 138 mg/dL — ABNORMAL HIGH (ref 70–99)
Glucose-Capillary: 153 mg/dL — ABNORMAL HIGH (ref 70–99)
Glucose-Capillary: 202 mg/dL — ABNORMAL HIGH (ref 70–99)
Glucose-Capillary: 154 mg/dL — ABNORMAL HIGH (ref 70–99)

## 2019-06-12 LAB — C-REACTIVE PROTEIN: CRP: 7.2 mg/dL — ABNORMAL HIGH (ref <1.0)

## 2019-06-12 LAB — MAGNESIUM: Magnesium: 1.9 mg/dL (ref 1.7–2.4)

## 2019-06-12 LAB — D-DIMER, QUANTITATIVE: D-Dimer, Quant: 0.73 ug/mL-FEU — ABNORMAL HIGH (ref 0.00–0.50)

## 2019-06-12 MED ORDER — INSULIN DETEMIR 100 UNIT/ML ~~LOC~~ SOLN
33.0000 [IU] | Freq: Two times a day (BID) | SUBCUTANEOUS | Status: DC
  Administered 2019-06-12 – 2019-06-15 (×8): 33 [IU] via SUBCUTANEOUS
  Filled 2019-06-12 (×11): qty 0.33

## 2019-06-12 MED ORDER — INSULIN ASPART 100 UNIT/ML ~~LOC~~ SOLN
8.0000 [IU] | Freq: Three times a day (TID) | SUBCUTANEOUS | Status: DC
  Administered 2019-06-12 – 2019-06-15 (×12): 8 [IU] via SUBCUTANEOUS

## 2019-06-12 NOTE — Progress Notes (Signed)
[facility] PROGRESS NOTE    Progress Note  James Gashndrea Canul  ZOX:[id number] DOB: 02/18/1956 DOA: 06/09/2019 PCP: Patient, [name]     Brief Narrative:   James York is an 63 y.o. male past medical history of essential hypertension, diabetes mellitus type 2 with a normal hemoglobin A1c comes into the ED for persistent cough chest pain shortness of breath that started 4 days prior to admission.  COVID-19 treatment: IV remdesivir started on 06/09/2019 Steroids started on 06/09/2019 Convalescent plasma on 06/09/2019 Actemra on 06/11/2019  Assessment/Plan:   Sepsis/acute respiratory failure with hypoxia due to Pneumonia due to COVID-19 virus: Patient is still requiring 7 L of oxygen to keep saturations greater than 88%. Continue IV remdesivir steroids vitamin C and zinc.  He received Actemra and convalescent plasma. Should keep the patient peripherally 16 hours a day, if not prone out of bed to chair. Consult physical therapy. He relates he feels like his breathing is getting better.  Hypokalemia: Replete orally now resolved.  Chronic renal disease IIIB: He has remained at baseline.  Diabetes mellitus type 2, uncontrolled (HCC): With an A1c of 7.4, continue sliding scale insulin. The patient is tolerating his diet increase long-acting insulin. CBGs before meals and at bedtime.  Essential hypertension Stable controlled.  Pleuritic chest pain Now resolved work-up has been negative unlikely ACS.   DVT prophylaxis: Eliquis Family Communication:none Disposition Plan/Barrier to D/C: Unable to determine. Code Status:     Code Status Orders  (From admission, onward)         Start     Ordered   06/09/19 1357  Full code  Continuous     06/09/19 1400        Code Status History    Date Active Date Inactive Code Status Order ID Comments User Context   06/08/2019 2223 06/09/2019 1347 Full Code [id number]  Mansy, Vernetta HoneyJan A, MD ED   Advance Care Planning Activity        IV Access:    Peripheral IV   Procedures and diagnostic studies:   No results found.   Medical Consultants:    None.  Anti-Infectives:   IV remdesivir  Subjective:    James York relates that is like his breathing is getting better.  Objective:    Vitals:   06/11/19 1541 06/11/19 2000 06/12/19 0008 06/12/19 0400  BP: 121/63 127/74 (!) 111/58 114/65  Pulse: 78     Resp: 20     Temp: 98.1 F (36.7 C) 98 F (36.7 C) 97.9 F (36.6 C) 98.3 F (36.8 C)  TempSrc: Oral Oral Oral Oral  SpO2: 93% 92% 90%   Weight:      Height:       SpO2: 90 % O2 Flow Rate (L/min): 7 L/min   Intake/Output Summary (Last 24 hours) at 06/12/2019 0729 Last data filed at 06/12/2019 0400 Gross per 24 hour  Intake 810 ml  Output 1725 ml  Net -915 ml   Filed Weights   06/11/19 0658  Weight: (!) 137.3 kg    Exam: General exam: In no acute distress. Respiratory system: Good air movement and diffuse crackles. Cardiovascular system: S1 & S2 heard, RRR. No JVD. Gastrointestinal system: Abdomen is nondistended, soft and nontender.  Central nervous system: Alert and oriented. No focal neurological deficits. Extremities: No pedal edema. Skin: No rashes, lesions or ulcers Psychiatry: Judgement and insight appear normal. Mood & affect appropriate.    Data Reviewed:    Labs: Basic Metabolic Panel: Recent Labs  Lab 06/08/19 1810 06/08/19 2048 06/09/19 0411 06/10/19 0420 06/11/19 0418  NA 137  --  136 135 139  K 3.2*  --  3.9 4.1 4.7  CL 105  --  103 101 102  CO2 22  --  24 20* 26  GLUCOSE 129*  --  110* 272* 235*  BUN 18  --  17 21 33*  CREATININE 1.94*  --  1.82* 2.09* 2.05*  CALCIUM 8.2*  --  7.6* 8.0* 8.3*  MG  --  1.9  --  1.8 2.1   GFR Estimated Creatinine Clearance: 54.4 mL/min (A) (by C-G formula based on SCr of 2.05 mg/dL (H)). Liver Function Tests: Recent Labs  Lab 06/09/19 1435 06/10/19 0420 06/11/19 0418  AST 50* 50* 39  ALT 27 29 26    ALKPHOS 50 50 44  BILITOT 0.6 0.6 0.6  PROT 6.4* 7.0 6.5  ALBUMIN 2.9* 3.3* 2.9*   No results for input(s): LIPASE, AMYLASE in the last 168 hours. No results for input(s): AMMONIA in the last 168 hours. Coagulation profile No results for input(s): INR, PROTIME in the last 168 hours. COVID-19 Labs  Recent Labs    06/09/19 1435 06/10/19 0420 06/11/19 0418  DDIMER 1.04* 0.89* 0.64*  CRP 16.1* 18.6* 11.3*    Lab Results  Component Value Date   SARSCOV2NAA POSITIVE (A) 06/08/2019    CBC: Recent Labs  Lab 06/08/19 1810 06/09/19 0411 06/09/19 1435 06/10/19 0420 06/11/19 0418  WBC 4.0 4.5 3.6* 2.6* 4.6  NEUTROABS  --   --   --  1.9 3.3  HGB 11.7* 10.7* 10.3* 10.9* 10.6*  HCT 33.5* 33.2* 31.4* 33.1* 32.3*  MCV 82.7 87.4 87.7 86.6 87.8  PLT 169 158 158 163 201   Cardiac Enzymes: No results for input(s): CKTOTAL, CKMB, CKMBINDEX, TROPONINI in the last 168 hours. BNP (last 3 results) No results for input(s): PROBNP in the last 8760 hours. CBG: Recent Labs  Lab 06/10/19 1617 06/10/19 2147 06/11/19 0728 06/11/19 1137 06/11/19 1618  GLUCAP 191* 261* 206* 175* 214*   D-Dimer: Recent Labs    06/10/19 0420 06/11/19 0418  DDIMER 0.89* 0.64*   Hgb A1c: No results for input(s): HGBA1C in the last 72 hours. Lipid Profile: No results for input(s): CHOL, HDL, LDLCALC, TRIG, CHOLHDL, LDLDIRECT in the last 72 hours. Thyroid function studies: No results for input(s): TSH, T4TOTAL, T3FREE, THYROIDAB in the last 72 hours.  Invalid input(s): FREET3 Anemia work up: No results for input(s): VITAMINB12, FOLATE, FERRITIN, TIBC, IRON, RETICCTPCT in the last 72 hours. Sepsis Labs: Recent Labs  Lab 06/09/19 0411 06/09/19 1435 06/10/19 0420 06/11/19 0418  PROCALCITON  --  0.12  --   --   WBC 4.5 3.6* 2.6* 4.6   Microbiology Recent Results (from the past 240 hour(s))  SARS CORONAVIRUS 2 (TAT 6-24 HRS) Nasopharyngeal Nasopharyngeal Swab     Status: Abnormal   Collection  Time: 06/08/19  9:30 PM   Specimen: Nasopharyngeal Swab  Result Value Ref Range Status   SARS Coronavirus 2 POSITIVE (A) NEGATIVE Final    Comment: RESULT CALLED TO, READ BACK BY AND VERIFIED WITH: MM [id number] 06/09/2019 T. TYSOR (NOTE) SARS-CoV-2 target nucleic acids are DETECTED. The SARS-CoV-2 RNA is generally detectable in upper and lower respiratory specimens during the acute phase of infection. Positive results are indicative of the presence of SARS-CoV-2 RNA. Clinical correlation with patient history and other diagnostic information is  necessary to determine patient infection status. Positive results do not rule out  bacterial infection or co-infection with other viruses.  The expected result is Negative. Fact Sheet for Patients: SugarRoll[employer]be Fact Sheet for Healthcare Providers: https://www.woods-mathews.com/ This test is not yet approved or cleared by the [place] FDA and  has been authorized for detection and/or diagnosis of SARS-CoV-2 by FDA under an Emergency Use Authorization (EUA). This EUA will remain  in effect (meaning this test can be used) for th e duration of the COVID-19 declaration under Section 564(b)(1) of the Act, 21 U.S.C. section 360bbb-3(b)(1), unless the authorization is terminated or revoked sooner. Performed at [facility], [place] [address] [place], [place] [address]   Blood culture (routine x 2)     Status: None (Preliminary result)   Collection Time: 06/08/19 11:09 PM   Specimen: BLOOD  Result Value Ref Range Status   Specimen Description   Final    BLOOD Blood Culture results may not be optimal due to an excessive volume of blood received in culture bottles   Special Requests   Final    BOTTLES DRAWN AEROBIC AND ANAEROBIC BLOOD LEFT HAND   Culture   Final    NO GROWTH 2 DAYS Performed at [facility], [address] [place], [place] [address]    Report Status PENDING   Incomplete  Blood culture (routine x 2)     Status: None (Preliminary result)   Collection Time: 06/08/19 11:09 PM   Specimen: BLOOD  Result Value Ref Range Status   Specimen Description BLOOD Blood Culture adequate volume  Final   Special Requests BOTTLES DRAWN AEROBIC AND ANAEROBIC BLOOD LEFT ARM  Final   Culture   Final    NO GROWTH 2 DAYS Performed at [facility], [address] [place], [place] [address]    Report Status PENDING  Incomplete     Medications:   . sodium chloride   Intravenous Once  . amLODipine  10 mg Oral Daily  . dexamethasone (DECADRON) injection  6 mg Intravenous Q12H  . enoxaparin (LOVENOX) injection  70 mg Subcutaneous Q24H  . insulin aspart  0-20 Units Subcutaneous TID WC  . insulin aspart  0-5 Units Subcutaneous QHS  . insulin aspart  6 Units Subcutaneous TID WC  . insulin detemir  28 Units Subcutaneous BID  . sodium chloride flush  3 mL Intravenous Q12H   Continuous Infusions: . sodium chloride    . remdesivir 100 mg in NS 100 mL Stopped (06/11/19 1000)    LOS: 3 days   Charlynne Cousins  [facility]  06/12/2019, 7:29 AM

## 2019-06-13 LAB — CBC WITH DIFFERENTIAL/PLATELET
Abs Immature Granulocytes: 0.14 10*3/uL — ABNORMAL HIGH (ref 0.00–0.07)
Basophils Absolute: 0 10*3/uL (ref 0.0–0.1)
Basophils Relative: 0 %
Eosinophils Absolute: 0 10*3/uL (ref 0.0–0.5)
Eosinophils Relative: 0 %
HCT: 35.6 % — ABNORMAL LOW (ref 39.0–52.0)
Hemoglobin: 11.7 g/dL — ABNORMAL LOW (ref 13.0–17.0)
Immature Granulocytes: 1 %
Lymphocytes Relative: 13 %
Lymphs Abs: 1.4 10*3/uL (ref 0.7–4.0)
MCH: 28.5 pg (ref 26.0–34.0)
MCHC: 32.9 g/dL (ref 30.0–36.0)
MCV: 86.8 fL (ref 80.0–100.0)
Monocytes Absolute: 0.9 10*3/uL (ref 0.1–1.0)
Monocytes Relative: 8 %
Neutro Abs: 9 10*3/uL — ABNORMAL HIGH (ref 1.7–7.7)
Neutrophils Relative %: 78 %
Platelets: 313 10*3/uL (ref 150–400)
RBC: 4.1 MIL/uL — ABNORMAL LOW (ref 4.22–5.81)
RDW: 13.2 % (ref 11.5–15.5)
WBC: 11.5 10*3/uL — ABNORMAL HIGH (ref 4.0–10.5)
nRBC: 0.3 % — ABNORMAL HIGH (ref 0.0–0.2)

## 2019-06-13 LAB — COMPREHENSIVE METABOLIC PANEL
ALT: 39 U/L (ref 0–44)
AST: 44 U/L — ABNORMAL HIGH (ref 15–41)
Albumin: 2.9 g/dL — ABNORMAL LOW (ref 3.5–5.0)
Alkaline Phosphatase: 46 U/L (ref 38–126)
Anion gap: 8 (ref 5–15)
BUN: 33 mg/dL — ABNORMAL HIGH (ref 8–23)
CO2: 27 mmol/L (ref 22–32)
Calcium: 8.4 mg/dL — ABNORMAL LOW (ref 8.9–10.3)
Chloride: 105 mmol/L (ref 98–111)
Creatinine, Ser: 1.54 mg/dL — ABNORMAL HIGH (ref 0.61–1.24)
GFR calc Af Amer: 55 mL/min — ABNORMAL LOW (ref >60)
GFR calc non Af Amer: 48 mL/min — ABNORMAL LOW (ref >60)
Glucose, Bld: 126 mg/dL — ABNORMAL HIGH (ref 70–99)
Potassium: 4 mmol/L (ref 3.5–5.1)
Sodium: 140 mmol/L (ref 135–145)
Total Bilirubin: 0.4 mg/dL (ref 0.3–1.2)
Total Protein: 6.6 g/dL (ref 6.5–8.1)

## 2019-06-13 LAB — C-REACTIVE PROTEIN: CRP: 4.4 mg/dL — ABNORMAL HIGH (ref <1.0)

## 2019-06-13 LAB — D-DIMER, QUANTITATIVE: D-Dimer, Quant: 0.8 ug/mL-FEU — ABNORMAL HIGH (ref 0.00–0.50)

## 2019-06-13 LAB — GLUCOSE, CAPILLARY
Glucose-Capillary: 125 mg/dL — ABNORMAL HIGH (ref 70–99)
Glucose-Capillary: 182 mg/dL — ABNORMAL HIGH (ref 70–99)
Glucose-Capillary: 289 mg/dL — ABNORMAL HIGH (ref 70–99)
Glucose-Capillary: 333 mg/dL — ABNORMAL HIGH (ref 70–99)

## 2019-06-13 LAB — MAGNESIUM: Magnesium: 2 mg/dL (ref 1.7–2.4)

## 2019-06-13 NOTE — Progress Notes (Signed)
PROGRESS NOTE    James York  ZOX:[id number] DOB: January 29, 1956 DOA: 06/09/2019 PCP: Patient, [name]    Brief Narrative:  63 year old gentleman with history of hypertension, type 2 diabetes on oral hypoglycemics presented to the emergency room with chest pain and shortness of breath started about 4 days prior to admission.  He had generalized symptoms of fatigue and diarrhea before starting pulmonary symptoms.  Is admitted to hospital treated for COVID-19 associated pneumonia.   Assessment & Plan:   Active Problems:   Pneumonia due to COVID-19 virus   Acute respiratory failure with hypoxia (HCC)   Hypokalemia   Diabetes mellitus type 2, uncontrolled (HCC)   Essential hypertension   Pleuritic chest pain   AKI (acute kidney injury) (HCC)   Sepsis (HCC)   Full code status  Pneumonia due to COVID-19 virus: Acute hypoxemic respiratory failure. Continue to monitor due to significant symptoms  chest physiotherapy, incentive spirometry, deep breathing exercises, sputum induction, mucolytic's and bronchodilators. Supplemental oxygen to keep saturations more than 90%. Covid directed therapy with , steroids, on dexamethasone remdesivir, day 5/5 actemra, received 1 dose on 06/11/2019 convalescent plasma, received 1 dose on 06/09/2019 antibiotics, not indicated Due to severity of symptoms, patient will need daily inflammatory markers,  liver function test to monitor and direct COVID-19 therapies.  Hypokalemia: Replaced.  Acute kidney injury on chronic kidney disease stage IIIb: At about his baseline.  Diabetes type 2, uncontrolled with hyperglycemia: A1c 7.4.  Commercial truck driver, is on oral hypoglycemics.  Fairly controlled as per patient when not on steroids.  Aggravated by steroid use.  Currently remains on long-acting insulin and sliding scale coverage.  Fairly stable.  Hypertension: Blood pressure stable.  On amlodipine.   DVT prophylaxis: Lovenox Lovenox subcu Code  Status: Full code Family Communication: None.  Patient is talking to his family. Disposition Plan: Home after medical stabilization.  He still on significant oxygen.   Consultants:   None  Procedures:   None  Antimicrobials:   Remdesivir, 06/09/2019-06/13/2019   Subjective: Patient seen and examined.  Afebrile overnight.  Still on 6 L of oxygen.  Breathing overall improved.  He is eager to drive his trailer back to [place] that is parked in the hospital parking lot.  Objective: Vitals:   06/12/19 2000 06/13/19 0019 06/13/19 0447 06/13/19 0848  BP: 125/70 117/60  130/79  Pulse: 62 71 (!) 57 65  Resp: (!) 22 18 18  (!) 30  Temp: 97.9 F (36.6 C) 98 F (36.7 C) 97.9 F (36.6 C) 98.1 F (36.7 C)  TempSrc: Oral Oral Oral Oral  SpO2: 93% 95% 92% 94%  Weight:      Height:        Intake/Output Summary (Last 24 hours) at 06/13/2019 1333 Last data filed at 06/13/2019 0600 Gross per 24 hour  Intake 480 ml  Output 1050 ml  Net -570 ml   Filed Weights   06/11/19 0658  Weight: (!) 137.3 kg    Examination:  General exam: Appears calm and comfortable, was eating breakfast on my examination. Respiratory system: Clear to auscultation. Respiratory effort normal.  No added sound.  On 6 L of oxygen. Cardiovascular system: S1 & S2 heard, RRR. No JVD, murmurs, rubs, gallops or clicks. No pedal edema. Gastrointestinal system: Abdomen is nondistended, soft and nontender. No organomegaly or masses felt. Normal bowel sounds heard. Central nervous system: Alert and oriented. No focal neurological deficits. Extremities: Symmetric 5 x 5 power. Skin: No rashes, lesions or ulcers Psychiatry: Judgement  and insight appear normal. Mood & affect appropriate.     Data Reviewed: I have personally reviewed following labs and imaging studies  CBC: Recent Labs  Lab 06/09/19 1435 06/10/19 0420 06/11/19 0418 06/12/19 0845 06/13/19 0630  WBC 3.6* 2.6* 4.6 9.6 11.5*  NEUTROABS  --  1.9 3.3  7.5 9.0*  HGB 10.3* 10.9* 10.6* 11.5* 11.7*  HCT 31.4* 33.1* 32.3* 34.4* 35.6*  MCV 87.7 86.6 87.8 86.2 86.8  PLT 158 163 201 274 313   Basic Metabolic Panel: Recent Labs  Lab 06/08/19 2048 06/09/19 0411 06/10/19 0420 06/11/19 0418 06/12/19 0845 06/13/19 0630  NA  --  136 135 139 141 140  K  --  3.9 4.1 4.7 4.0 4.0  CL  --  103 101 102 105 105  CO2  --  24 20* 26 25 27   GLUCOSE  --  110* 272* 235* 148* 126*  BUN  --  17 21 33* 30* 33*  CREATININE  --  1.82* 2.09* 2.05* 1.69* 1.54*  CALCIUM  --  7.6* 8.0* 8.3* 8.5* 8.4*  MG 1.9  --  1.8 2.1 1.9 2.0   GFR: Estimated Creatinine Clearance: 72.4 mL/min (A) (by C-G formula based on SCr of 1.54 mg/dL (H)). Liver Function Tests: Recent Labs  Lab 06/09/19 1435 06/10/19 0420 06/11/19 0418 06/12/19 0845 06/13/19 0630  AST 50* 50* 39 52* 44*  ALT 27 29 26  34 39  ALKPHOS 50 50 44 47 46  BILITOT 0.6 0.6 0.6 0.4 0.4  PROT 6.4* 7.0 6.5 6.6 6.6  ALBUMIN 2.9* 3.3* 2.9* 2.9* 2.9*   No results for input(s): LIPASE, AMYLASE in the last 168 hours. No results for input(s): AMMONIA in the last 168 hours. Coagulation Profile: No results for input(s): INR, PROTIME in the last 168 hours. Cardiac Enzymes: No results for input(s): CKTOTAL, CKMB, CKMBINDEX, TROPONINI in the last 168 hours. BNP (last 3 results) No results for input(s): PROBNP in the last 8760 hours. HbA1C: No results for input(s): HGBA1C in the last 72 hours. CBG: Recent Labs  Lab 06/12/19 1155 06/12/19 1636 06/12/19 2248 06/13/19 0846 06/13/19 1216  GLUCAP 153* 202* 154* 125* 182*   Lipid Profile: No results for input(s): CHOL, HDL, LDLCALC, TRIG, CHOLHDL, LDLDIRECT in the last 72 hours. Thyroid Function Tests: No results for input(s): TSH, T4TOTAL, FREET4, T3FREE, THYROIDAB in the last 72 hours. Anemia Panel: No results for input(s): VITAMINB12, FOLATE, FERRITIN, TIBC, IRON, RETICCTPCT in the last 72 hours. Sepsis Labs: Recent Labs  Lab 06/09/19 1435   PROCALCITON 0.12    Recent Results (from the past 240 hour(s))  SARS CORONAVIRUS 2 (TAT 6-24 HRS) Nasopharyngeal Nasopharyngeal Swab     Status: Abnormal   Collection Time: 06/08/19  9:30 PM   Specimen: Nasopharyngeal Swab  Result Value Ref Range Status   SARS Coronavirus 2 POSITIVE (A) NEGATIVE Final    Comment: RESULT CALLED TO, READ BACK BY AND VERIFIED WITH: M. 14/04/20 0326 06/09/2019 T. TYSOR (NOTE) SARS-CoV-2 target nucleic acids are DETECTED. The SARS-CoV-2 RNA is generally detectable in upper and lower respiratory specimens during the acute phase of infection. Positive results are indicative of the presence of SARS-CoV-2 RNA. Clinical correlation with patient history and other diagnostic information is  necessary to determine patient infection status. Positive results do not rule out bacterial infection or co-infection with other viruses.  The expected result is Negative. Fact Sheet for Patients: Mariel Aloe Fact Sheet for Healthcare Providers: 14/11/2018 This test is not yet approved or cleared by  the [employer] and  has been authorized for detection and/or diagnosis of SARS-CoV-2 by FDA under an Emergency Use Authorization (EUA). This EUA will remain  in effect (meaning this test can be used) for th e duration of the COVID-19 declaration under Section 564(b)(1) of the Act, 21 U.S.C. section 360bbb-3(b)(1), unless the authorization is terminated or revoked sooner. Performed at [facility], [place] [address], [place], [place] [address]   Blood culture (routine x 2)     Status: None (Preliminary result)   Collection Time: 06/08/19 11:09 PM   Specimen: BLOOD  Result Value Ref Range Status   Specimen Description   Final    BLOOD Blood Culture results may not be optimal due to an excessive volume of blood received in culture bottles   Special Requests   Final    BOTTLES DRAWN AEROBIC AND ANAEROBIC  BLOOD LEFT HAND   Culture   Final    NO GROWTH 4 DAYS Performed at [facility], [address], [place], [place] [address]    Report Status PENDING  Incomplete  Blood culture (routine x 2)     Status: None (Preliminary result)   Collection Time: 06/08/19 11:09 PM   Specimen: BLOOD  Result Value Ref Range Status   Specimen Description BLOOD Blood Culture adequate volume  Final   Special Requests BOTTLES DRAWN AEROBIC AND ANAEROBIC BLOOD LEFT ARM  Final   Culture   Final    NO GROWTH 4 DAYS Performed at [facility], [address], [place], [place] [address]    Report Status PENDING  Incomplete         Radiology Studies: No results found.      Scheduled Meds: . sodium chloride   Intravenous Once  . amLODipine  10 mg Oral Daily  . dexamethasone (DECADRON) injection  6 mg Intravenous Q12H  . enoxaparin (LOVENOX) injection  70 mg Subcutaneous Q24H  . insulin aspart  0-20 Units Subcutaneous TID WC  . insulin aspart  0-5 Units Subcutaneous QHS  . insulin aspart  8 Units Subcutaneous TID WC  . insulin detemir  33 Units Subcutaneous BID  . sodium chloride flush  3 mL Intravenous Q12H   Continuous Infusions: . sodium chloride       LOS: 4 days    Time spent: 30 minutes    Barb Merino, MD [facility] Pager [phone]

## 2019-06-14 LAB — CBC WITH DIFFERENTIAL/PLATELET
Abs Immature Granulocytes: 0.24 10*3/uL — ABNORMAL HIGH (ref 0.00–0.07)
Basophils Absolute: 0 10*3/uL (ref 0.0–0.1)
Basophils Relative: 0 %
Eosinophils Absolute: 0 10*3/uL (ref 0.0–0.5)
Eosinophils Relative: 0 %
HCT: 34.7 % — ABNORMAL LOW (ref 39.0–52.0)
Hemoglobin: 11.5 g/dL — ABNORMAL LOW (ref 13.0–17.0)
Immature Granulocytes: 2 %
Lymphocytes Relative: 9 %
Lymphs Abs: 1.3 10*3/uL (ref 0.7–4.0)
MCH: 28.9 pg (ref 26.0–34.0)
MCHC: 33.1 g/dL (ref 30.0–36.0)
MCV: 87.2 fL (ref 80.0–100.0)
Monocytes Absolute: 1 10*3/uL (ref 0.1–1.0)
Monocytes Relative: 7 %
Neutro Abs: 11.6 10*3/uL — ABNORMAL HIGH (ref 1.7–7.7)
Neutrophils Relative %: 82 %
Platelets: 342 10*3/uL (ref 150–400)
RBC: 3.98 MIL/uL — ABNORMAL LOW (ref 4.22–5.81)
RDW: 13.2 % (ref 11.5–15.5)
WBC: 14.1 10*3/uL — ABNORMAL HIGH (ref 4.0–10.5)
nRBC: 0.2 % (ref 0.0–0.2)

## 2019-06-14 LAB — CULTURE, BLOOD (ROUTINE X 2)
Culture: NO GROWTH
Culture: NO GROWTH
Specimen Description: ADEQUATE

## 2019-06-14 LAB — GLUCOSE, CAPILLARY
Glucose-Capillary: 130 mg/dL — ABNORMAL HIGH (ref 70–99)
Glucose-Capillary: 183 mg/dL — ABNORMAL HIGH (ref 70–99)
Glucose-Capillary: 253 mg/dL — ABNORMAL HIGH (ref 70–99)
Glucose-Capillary: 237 mg/dL — ABNORMAL HIGH (ref 70–99)

## 2019-06-14 LAB — COMPREHENSIVE METABOLIC PANEL
ALT: 34 U/L (ref 0–44)
AST: 26 U/L (ref 15–41)
Albumin: 2.7 g/dL — ABNORMAL LOW (ref 3.5–5.0)
Alkaline Phosphatase: 44 U/L (ref 38–126)
Anion gap: 9 (ref 5–15)
BUN: 34 mg/dL — ABNORMAL HIGH (ref 8–23)
CO2: 27 mmol/L (ref 22–32)
Calcium: 8.3 mg/dL — ABNORMAL LOW (ref 8.9–10.3)
Chloride: 101 mmol/L (ref 98–111)
Creatinine, Ser: 1.64 mg/dL — ABNORMAL HIGH (ref 0.61–1.24)
GFR calc Af Amer: 51 mL/min — ABNORMAL LOW (ref >60)
GFR calc non Af Amer: 44 mL/min — ABNORMAL LOW (ref >60)
Glucose, Bld: 179 mg/dL — ABNORMAL HIGH (ref 70–99)
Potassium: 4.1 mmol/L (ref 3.5–5.1)
Sodium: 137 mmol/L (ref 135–145)
Total Bilirubin: 0.4 mg/dL (ref 0.3–1.2)
Total Protein: 6.1 g/dL — ABNORMAL LOW (ref 6.5–8.1)

## 2019-06-14 LAB — D-DIMER, QUANTITATIVE: D-Dimer, Quant: 0.79 ug/mL-FEU — ABNORMAL HIGH (ref 0.00–0.50)

## 2019-06-14 LAB — MAGNESIUM: Magnesium: 1.9 mg/dL (ref 1.7–2.4)

## 2019-06-14 LAB — C-REACTIVE PROTEIN: CRP: 2.9 mg/dL — ABNORMAL HIGH (ref <1.0)

## 2019-06-14 MED ORDER — DEXAMETHASONE 6 MG PO TABS
6.0000 mg | ORAL_TABLET | Freq: Every day | ORAL | Status: DC
  Administered 2019-06-15: 6 mg via ORAL
  Filled 2019-06-14: qty 1

## 2019-06-14 NOTE — Progress Notes (Signed)
PROGRESS NOTE    James York  JOA:[id number] DOB: [birth year] DOA: 06/09/2019 PCP: Patient, [name]    Brief Narrative:  63 year old gentleman with history of hypertension, type 2 diabetes on oral hypoglycemics presented to the emergency room with chest pain and shortness of breath started about 4 days prior to admission.  He had generalized symptoms of fatigue and diarrhea before starting pulmonary symptoms.  Is admitted to hospital treated for COVID-19 associated pneumonia.   Assessment & Plan:   Active Problems:   Pneumonia due to COVID-19 virus   Acute respiratory failure with hypoxia (HCC)   Hypokalemia   Diabetes mellitus type 2, uncontrolled (HCC)   Essential hypertension   Pleuritic chest pain   AKI (acute kidney injury) (HCC)   Sepsis (HCC)   Full code status  Pneumonia due to COVID-19 virus: Acute hypoxemic respiratory failure. Continue to monitor due to significant symptoms.  Appropriately responding to therapies. chest physiotherapy, incentive spirometry, deep breathing exercises, sputum induction, mucolytic's and bronchodilators. Supplemental oxygen to keep saturations more than 90%. Covid directed therapy with , steroids, on dexamethasone remdesivir, finished course. actemra, received 1 dose on 06/11/2019 convalescent plasma, received 1 dose on 06/09/2019 antibiotics, not indicated Still on significant oxygen.  We will continue to monitor and wean off.  Hypokalemia: Replaced.  Acute kidney injury on chronic kidney disease stage IIIb: At about his baseline.  Diabetes type 2, uncontrolled with hyperglycemia: A1c 7.4.  Commercial truck driver, is on oral hypoglycemics.  Fairly controlled as per patient when not on steroids.  Aggravated by steroid use.  Currently remains on long-acting insulin and sliding scale coverage.  Fairly stable.  Hypertension: Blood pressure stable.  On amlodipine.   DVT prophylaxis: Lovenox subcu Code Status: Full code Family  Communication: None.  Patient is talking to his family. Disposition Plan: Home after medical stabilization.  Still on 3 to 4 L oxygen.   Consultants:   None  Procedures:   None  Antimicrobials:   Remdesivir, 06/09/2019-06/13/2019   Subjective: Seen and examined.  Breathing improved.  On 3 L oxygen.  Was able to walk in the room with some shortness of breath.  Objective: Vitals:   06/14/19 0100 06/14/19 0300 06/14/19 0440 06/14/19 0800  BP:   106/72 (!) 99/55  Pulse:   (!) 52 60  Resp:   18 18  Temp:   97.8 F (36.6 C) 98.4 F (36.9 C)  TempSrc:   Oral Oral  SpO2: 96% 97% 95% 94%  Weight:      Height:        Intake/Output Summary (Last 24 hours) at 06/14/2019 1243 Last data filed at 06/14/2019 0343 Gross per 24 hour  Intake 3 ml  Output 1500 ml  Net -1497 ml   Filed Weights   06/11/19 0658  Weight: (!) 137.3 kg    Examination:  General exam: Appears calm and comfortable, was eating breakfast on my examination. Respiratory system: Clear to auscultation. Respiratory effort normal.  No added sound.  On 3 L of oxygen. Cardiovascular system: S1 & S2 heard, RRR. No JVD, murmurs, rubs, gallops or clicks. No pedal edema. Gastrointestinal system: Abdomen is nondistended, soft and nontender. No organomegaly or masses felt. Normal bowel sounds heard. Central nervous system: Alert and oriented. No focal neurological deficits. Extremities: Symmetric 5 x 5 power. Skin: No rashes, lesions or ulcers Psychiatry: Judgement and insight appear normal. Mood & affect appropriate.     Data Reviewed: I have personally reviewed following labs and imaging studies  CBC: Recent Labs  Lab 06/10/19 0420 06/11/19 0418 06/12/19 0845 06/13/19 0630 06/14/19 0400  WBC 2.6* 4.6 9.6 11.5* 14.1*  NEUTROABS 1.9 3.3 7.5 9.0* 11.6*  HGB 10.9* 10.6* 11.5* 11.7* 11.5*  HCT 33.1* 32.3* 34.4* 35.6* 34.7*  MCV 86.6 87.8 86.2 86.8 87.2  PLT 163 201 274 313 342   Basic Metabolic Panel:  Recent Labs  Lab 06/10/19 0420 06/11/19 0418 06/12/19 0845 06/13/19 0630 06/14/19 0400  NA 135 139 141 140 137  K 4.1 4.7 4.0 4.0 4.1  CL 101 102 105 105 101  CO2 20* 26 25 27 27   GLUCOSE 272* 235* 148* 126* 179*  BUN 21 33* 30* 33* 34*  CREATININE 2.09* 2.05* 1.69* 1.54* 1.64*  CALCIUM 8.0* 8.3* 8.5* 8.4* 8.3*  MG 1.8 2.1 1.9 2.0 1.9   GFR: Estimated Creatinine Clearance: 68 mL/min (A) (by C-G formula based on SCr of 1.64 mg/dL (H)). Liver Function Tests: Recent Labs  Lab 06/10/19 0420 06/11/19 0418 06/12/19 0845 06/13/19 0630 06/14/19 0400  AST 50* 39 52* 44* 26  ALT 29 26 34 39 34  ALKPHOS 50 44 47 46 44  BILITOT 0.6 0.6 0.4 0.4 0.4  PROT 7.0 6.5 6.6 6.6 6.1*  ALBUMIN 3.3* 2.9* 2.9* 2.9* 2.7*   No results for input(s): LIPASE, AMYLASE in the last 168 hours. No results for input(s): AMMONIA in the last 168 hours. Coagulation Profile: No results for input(s): INR, PROTIME in the last 168 hours. Cardiac Enzymes: No results for input(s): CKTOTAL, CKMB, CKMBINDEX, TROPONINI in the last 168 hours. BNP (last 3 results) No results for input(s): PROBNP in the last 8760 hours. HbA1C: No results for input(s): HGBA1C in the last 72 hours. CBG: Recent Labs  Lab 06/13/19 1216 06/13/19 1601 06/13/19 2041 06/14/19 0808 06/14/19 1127  GLUCAP 182* 289* 333* 130* 183*   Lipid Profile: No results for input(s): CHOL, HDL, LDLCALC, TRIG, CHOLHDL, LDLDIRECT in the last 72 hours. Thyroid Function Tests: No results for input(s): TSH, T4TOTAL, FREET4, T3FREE, THYROIDAB in the last 72 hours. Anemia Panel: No results for input(s): VITAMINB12, FOLATE, FERRITIN, TIBC, IRON, RETICCTPCT in the last 72 hours. Sepsis Labs: Recent Labs  Lab 06/09/19 1435  PROCALCITON 0.12    Recent Results (from the past 240 hour(s))  SARS CORONAVIRUS 2 (TAT 6-24 HRS) Nasopharyngeal Nasopharyngeal Swab     Status: Abnormal   Collection Time: 06/08/19  9:30 PM   Specimen: Nasopharyngeal Swab   Result Value Ref Range Status   SARS Coronavirus 2 POSITIVE (A) NEGATIVE Final    Comment: RESULT CALLED TO, READ BACK BY AND VERIFIED WITH: M. Mariel AloeBROWN,RN 0326 06/09/2019 T. TYSOR (NOTE) SARS-CoV-2 target nucleic acids are DETECTED. The SARS-CoV-2 RNA is generally detectable in upper and lower respiratory specimens during the acute phase of infection. Positive results are indicative of the presence of SARS-CoV-2 RNA. Clinical correlation with patient history and other diagnostic information is  necessary to determine patient infection status. Positive results do not rule out bacterial infection or co-infection with other viruses.  The expected result is Negative. Fact Sheet for Patients: HairSlick.nohttps://www.fda.gov/media/138098/download Fact Sheet for Healthcare Providers: quierodirigir.comhttps://www.fda.gov/media/138095/download This test is not yet approved or cleared by the [place] FDA and  has been authorized for detection and/or diagnosis of SARS-CoV-2 by FDA under an Emergency Use Authorization (EUA). This EUA will remain  in effect (meaning this test can be used) for th e duration of the COVID-19 declaration under Section 564(b)(1) of the Act, 21 U.S.C. section 360bbb-3(b)(1), unless  the authorization is terminated or revoked sooner. Performed at [facility], [place] [address] [place], [place] [address]   Blood culture (routine x 2)     Status: None   Collection Time: 06/08/19 11:09 PM   Specimen: BLOOD  Result Value Ref Range Status   Specimen Description   Final    BLOOD Blood Culture results may not be optimal due to an excessive volume of blood received in culture bottles   Special Requests   Final    BOTTLES DRAWN AEROBIC AND ANAEROBIC BLOOD LEFT HAND   Culture   Final    NO GROWTH 5 DAYS Performed at [facility], [address], [place], [place] [address]    Report Status 06/14/2019 FINAL  Final  Blood culture (routine x 2)     Status: None   Collection Time:  06/08/19 11:09 PM   Specimen: BLOOD  Result Value Ref Range Status   Specimen Description BLOOD Blood Culture adequate volume  Final   Special Requests BOTTLES DRAWN AEROBIC AND ANAEROBIC BLOOD LEFT ARM  Final   Culture   Final    NO GROWTH 5 DAYS Performed at [facility], [address], [place], [place] [address]    Report Status 06/14/2019 FINAL  Final         Radiology Studies: No results found.      Scheduled Meds: . sodium chloride   Intravenous Once  . amLODipine  10 mg Oral Daily  . dexamethasone (DECADRON) injection  6 mg Intravenous Q12H  . enoxaparin (LOVENOX) injection  70 mg Subcutaneous Q24H  . insulin aspart  0-20 Units Subcutaneous TID WC  . insulin aspart  0-5 Units Subcutaneous QHS  . insulin aspart  8 Units Subcutaneous TID WC  . insulin detemir  33 Units Subcutaneous BID  . sodium chloride flush  3 mL Intravenous Q12H   Continuous Infusions: . sodium chloride       LOS: 5 days    Time spent: 30 minutes    Barb Merino, MD [facility] Pager [phone]

## 2019-06-15 LAB — GLUCOSE, CAPILLARY
Glucose-Capillary: 112 mg/dL — ABNORMAL HIGH (ref 70–99)
Glucose-Capillary: 188 mg/dL — ABNORMAL HIGH (ref 70–99)
Glucose-Capillary: 191 mg/dL — ABNORMAL HIGH (ref 70–99)
Glucose-Capillary: 232 mg/dL — ABNORMAL HIGH (ref 70–99)

## 2019-06-15 MED ORDER — DEXAMETHASONE 6 MG PO TABS: 6.0000 mg | ORAL_TABLET | Freq: Every day | ORAL | 0 refills | Status: AC

## 2019-06-15 MED FILL — DEXAMETHASONE 4 MG TABLET: 4 | 4 days supply | Qty: 6 | Fill #0

## 2019-06-15 NOTE — Discharge Summary (Signed)
Physician Discharge Summary  Nicanor Mendolia UXN:[id number] DOB: Aug 21, 1955 DOA: 06/09/2019  PCP: Patient, [name]  Admit date: 06/09/2019 Discharge date: 06/15/2019  Admitted From: Home via [facility] emergency room Disposition: Home  Recommendations for Outpatient Follow-up:  1. Follow up with PCP in 1-2 weeks 2. Please obtain BMP/CBC in one week 3. You need to quarantine as well take all precautions for 1 more week.   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  He had generalized symptoms of fatigue and diarrhea before starting pulmonary symptoms.  Is admitted to hospital treated for COVID-19 associated pneumonia.  Patient was admitted to the hospital and treated for pneumonia due to COVID-19 virus and acute hypoxemic respiratory failure.  He needed multiple interventions.  Was treated with remdesivir for 5 days.  Actemra 1 dose, convalescent plasma 1 dose.  He received steroids, he will finish 4 more days of therapy with the steroids.  Patient was subsequently weaned off from nasal cannula oxygen to room air and has stabilized.  Today, he is asymptomatic, has some dry cough otherwise no active symptoms.  He will continue to take dexamethasone for 4 more days.  His blood sugars are elevated and he is on oral hypoglycemics at home.  He does not want to eat use insulin doses.  Was advised to monitor blood sugars closely and a close follow-up.  Patient had symptoms for about 1 week, he should be quarantine for 7 more days of total 2 weeks.  Discharge Diagnoses:  Active Problems:   Pneumonia due  to COVID-19 virus   Acute respiratory failure with hypoxia (HCC)   Hypokalemia   Diabetes mellitus type 2, uncontrolled (HCC)   Essential hypertension   Pleuritic chest pain   AKI (acute kidney injury) (HCC)   Sepsis (HCC)   Full code status    Discharge Instructions  Discharge Instructions    Call MD for:  difficulty breathing, headache or visual disturbances   Complete by: As directed    Call MD for:  temperature >100.4   Complete by: As directed    Diet Carb Modified   Complete by: As directed    Discharge instructions   Complete by: As directed    You will require 1 more week of current time. Can use Tylenol and over-the-counter cough medications if needed.   Increase activity slowly   Complete by: As directed      Allergies as of 06/15/2019   No Known Allergies     Medication List    TAKE these medications   amLODipine 10 MG tablet Commonly known as: NORVASC Take 10 mg by mouth daily.   dexamethasone 6 MG tablet Commonly known as: DECADRON Take 1 tablet (6 mg total) by mouth daily for 4 days.   glipiZIDE 5 MG 24 hr tablet Commonly known as: GLUCOTROL XL Take 5 mg by mouth daily.   hydrochlorothiazide 12.5 MG capsule Commonly known as: MICROZIDE Take 12.5 mg by mouth daily.   ibuprofen 600 MG tablet Commonly known as: ADVIL Take 600 mg by mouth 2 (two) times daily as needed.   losartan 50 MG tablet Commonly known as: COZAAR Take 50 mg by  mouth daily.   metFORMIN 750 MG 24 hr tablet Commonly known as: GLUCOPHAGE-XR Take 750 mg by mouth daily.   terbinafine 250 MG tablet Commonly known as: LAMISIL Take 250 mg by mouth daily.       No Known Allergies  Consultations:  None   Procedures/Studies: DG Chest 2 View  Result Date: 06/08/2019 CLINICAL DATA:  Truck driver with weakness, diarrhea, tired and sleeping all the time, onset of symptoms 4-5 days ago, chest pain and shortness of breath a couple days ago with radiation to shoulder but no  pain at this time EXAM: CHEST - 2 VIEW COMPARISON:  None FINDINGS: Upper normal size of cardiac silhouette. Mediastinal contours and pulmonary vascularity normal. Mild RIGHT basilar atelectasis. A more focal area of nodular opacity 2.4 x 1.4 cm projects over the anterior RIGHT sixth rib, uncertain if represents superimposed infiltrate, pulmonary nodule or sclerosis within the anterior RIGHT sixth rib. Remaining lungs clear. No pleural effusion or pneumothorax. No additional osseous findings. IMPRESSION: Mild RIGHT basilar atelectasis with questionable area of infiltrate versus pulmonary nodule or sixth rib sclerosis at RIGHT lung base. Either radiographic follow-up until resolution or CT chest recommended to exclude pulmonary nodule. Electronically Signed   By: Ulyses SouthwardMark  Boles M.D.   On: 06/08/2019 19:02   CT Angio Chest PE W and/or Wo Contrast  Result Date: 06/09/2019 CLINICAL DATA:  Positive COVID-19 test. Chest pain and shortness of breath. EXAM: CT ANGIOGRAPHY CHEST WITH CONTRAST TECHNIQUE: Multidetector CT imaging of the chest was performed using the standard protocol during bolus administration of intravenous contrast. Multiplanar CT image reconstructions and MIPs were obtained to evaluate the vascular anatomy. CONTRAST:  75mL OMNIPAQUE IOHEXOL 350 MG/ML SOLN COMPARISON:  Chest x-ray June 08, 2019 FINDINGS: Cardiovascular: The heart is normal in size. Mild coronary artery calcifications are seen in the LAD. The thoracic aorta is normal. Central pulmonary arteries are normal in caliber. Evaluation for pulmonary emboli is somewhat limited due to respiratory motion and stairstep artifact. Within these limitations, no pulmonary emboli are identified. Mediastinum/Nodes: Shotty reactive nodes in the mediastinum. No effusions. The thyroid and esophagus are normal. There is a small pericardial effusion measuring 7 mm in thickness. Lungs/Pleura: Central airways are normal. No pneumothorax. Bilateral pulmonary  infiltrates involving all lobes are consistent with the patient's COVID-19 diagnosis. Upper Abdomen: No acute abnormality. Musculoskeletal: No chest wall abnormality. No acute or significant osseous findings. Review of the MIP images confirms the above findings. IMPRESSION: 1. Bilateral pulmonary infiltrates consistent with the patient's COVID-19 diagnosis. 2. Evaluation for pulmonary emboli is somewhat limited due to respiratory motion and stairstep artifact. However, within these limitations, no emboli are seen. 3. Mild coronary artery calcifications in the LAD. 4. Reactive nodes in the mediastinum. Electronically Signed   By: Gerome Samavid  Williams III M.D   On: 06/09/2019 00:51     Subjective: Patient seen and examined.  No overnight events.  Excited and anticipating to go home.  Walking around the room with maintaining saturations more than 91%.  Has some dry cough.   Discharge Exam: Vitals:   06/15/19 0458 06/15/19 0757  BP: 119/71 116/60  Pulse:  62  Resp: 17 18  Temp: 98.1 F (36.7 C) 98 F (36.7 C)  SpO2: 91% 97%   Vitals:   06/14/19 0800 06/14/19 1725 06/15/19 0458 06/15/19 0757  BP: (!) 99/55 (!) 112/54 119/71 116/60  Pulse: 60 60  62  Resp: 18 18 17 18   Temp: 98.4 F (36.9 C) 98.1 F (36.7 C)  98.1 F (36.7 C) 98 F (36.7 C)  TempSrc: Oral Oral Oral Oral  SpO2: 94% 92% 91% 97%  Weight:      Height:        General: Pt is alert, awake, not in acute distress, on room air.  Walking around. Cardiovascular: RRR, S1/S2 +, no rubs, no gallops Respiratory: CTA bilaterally, no wheezing, no rhonchi Abdominal: Soft, NT, ND, bowel sounds + Extremities: no edema, no cyanosis    The results of significant diagnostics from this hospitalization (including imaging, microbiology, ancillary and laboratory) are listed below for reference.     Microbiology: Recent Results (from the past 240 hour(s))  SARS CORONAVIRUS 2 (TAT 6-24 HRS) Nasopharyngeal Nasopharyngeal Swab     Status:  Abnormal   Collection Time: 06/08/19  9:30 PM   Specimen: Nasopharyngeal Swab  Result Value Ref Range Status   SARS Coronavirus 2 POSITIVE (A) NEGATIVE Final    Comment: RESULT CALLED TO, READ BACK BY AND VERIFIED WITH: M. Mariel Aloe 0326 06/09/2019 T. TYSOR (NOTE) SARS-CoV-2 target nucleic acids are DETECTED. The SARS-CoV-2 RNA is generally detectable in upper and lower respiratory specimens during the acute phase of infection. Positive results are indicative of the presence of SARS-CoV-2 RNA. Clinical correlation with patient history and other diagnostic information is  necessary to determine patient infection status. Positive results do not rule out bacterial infection or co-infection with other viruses.  The expected result is Negative. Fact Sheet for Patients: HairSlick.no Fact Sheet for Healthcare Providers: quierodirigir.com This test is not yet approved or cleared by the [place] FDA and  has been authorized for detection and/or diagnosis of SARS-CoV-2 by FDA under an Emergency Use Authorization (EUA). This EUA will remain  in effect (meaning this test can be used) for th e duration of the COVID-19 declaration under Section 564(b)(1) of the Act, 21 U.S.C. section 360bbb-3(b)(1), unless the authorization is terminated or revoked sooner. Performed at [facility], [address], [place], [place] [address]   Blood culture (routine x 2)     Status: None   Collection Time: 06/08/19 11:09 PM   Specimen: BLOOD  Result Value Ref Range Status   Specimen Description   Final    BLOOD Blood Culture results may not be optimal due to an excessive volume of blood received in culture bottles   Special Requests   Final    BOTTLES DRAWN AEROBIC AND ANAEROBIC BLOOD LEFT HAND   Culture   Final    NO GROWTH 5 DAYS Performed at [facility], [address], [place], [place] [address]    Report Status 06/14/2019 FINAL   Final  Blood culture (routine x 2)     Status: None   Collection Time: 06/08/19 11:09 PM   Specimen: BLOOD  Result Value Ref Range Status   Specimen Description BLOOD Blood Culture adequate volume  Final   Special Requests BOTTLES DRAWN AEROBIC AND ANAEROBIC BLOOD LEFT ARM  Final   Culture   Final    NO GROWTH 5 DAYS Performed at [facility], [address], [place], [place] [address]    Report Status 06/14/2019 FINAL  Final     Labs: BNP (last 3 results) No results for input(s): BNP in the last 8760 hours. Basic Metabolic Panel: Recent Labs  Lab 06/10/19 0420 06/11/19 0418 06/12/19 0845 06/13/19 0630 06/14/19 0400  NA 135 139 141 140 137  K 4.1 4.7 4.0 4.0 4.1  CL 101 102 105 105 101  CO2  20* 26 25 27 27   GLUCOSE 272* 235* 148* 126* 179*  BUN 21 33* 30* 33* 34*  CREATININE 2.09* 2.05* 1.69* 1.54* 1.64*  CALCIUM 8.0* 8.3* 8.5* 8.4* 8.3*  MG 1.8 2.1 1.9 2.0 1.9   Liver Function Tests: Recent Labs  Lab 06/10/19 0420 06/11/19 0418 06/12/19 0845 06/13/19 0630 06/14/19 0400  AST 50* 39 52* 44* 26  ALT 29 26 34 39 34  ALKPHOS 50 44 47 46 44  BILITOT 0.6 0.6 0.4 0.4 0.4  PROT 7.0 6.5 6.6 6.6 6.1*  ALBUMIN 3.3* 2.9* 2.9* 2.9* 2.7*   No results for input(s): LIPASE, AMYLASE in the last 168 hours. No results for input(s): AMMONIA in the last 168 hours. CBC: Recent Labs  Lab 06/10/19 0420 06/11/19 0418 06/12/19 0845 06/13/19 0630 06/14/19 0400  WBC 2.6* 4.6 9.6 11.5* 14.1*  NEUTROABS 1.9 3.3 7.5 9.0* 11.6*  HGB 10.9* 10.6* 11.5* 11.7* 11.5*  HCT 33.1* 32.3* 34.4* 35.6* 34.7*  MCV 86.6 87.8 86.2 86.8 87.2  PLT 163 201 274 313 342   Cardiac Enzymes: No results for input(s): CKTOTAL, CKMB, CKMBINDEX, TROPONINI in the last 168 hours. BNP: Invalid input(s): POCBNP CBG: Recent Labs  Lab 06/14/19 0808 06/14/19 1127 06/14/19 1721 06/14/19 2154 06/15/19 0756  GLUCAP 130* 183* 253* 237* 112*   D-Dimer Recent Labs    06/13/19 0630  06/14/19 0400  DDIMER 0.80* 0.79*   Hgb A1c No results for input(s): HGBA1C in the last 72 hours. Lipid Profile No results for input(s): CHOL, HDL, LDLCALC, TRIG, CHOLHDL, LDLDIRECT in the last 72 hours. Thyroid function studies No results for input(s): TSH, T4TOTAL, T3FREE, THYROIDAB in the last 72 hours.  Invalid input(s): FREET3 Anemia work up No results for input(s): VITAMINB12, FOLATE, FERRITIN, TIBC, IRON, RETICCTPCT in the last 72 hours. Urinalysis No results found for: COLORURINE, APPEARANCEUR, [place], [place], GLUCOSEU, [place], [place], [place], PROTEINUR, UROBILINOGEN, NITRITE, LEUKOCYTESUR Sepsis Labs Invalid input(s): PROCALCITONIN,  WBC,  LACTICIDVEN Microbiology Recent Results (from the past 240 hour(s))  SARS CORONAVIRUS 2 (TAT 6-24 HRS) Nasopharyngeal Nasopharyngeal Swab     Status: Abnormal   Collection Time: 06/08/19  9:30 PM   Specimen: Nasopharyngeal Swab  Result Value Ref Range Status   SARS Coronavirus 2 POSITIVE (A) NEGATIVE Final    Comment: RESULT CALLED TO, READ BACK BY AND VERIFIED WITH: M. Illene Regulus 0326 06/09/2019 T. TYSOR (NOTE) SARS-CoV-2 target nucleic acids are DETECTED. The SARS-CoV-2 RNA is generally detectable in upper and lower respiratory specimens during the acute phase of infection. Positive results are indicative of the presence of SARS-CoV-2 RNA. Clinical correlation with patient history and other diagnostic information is  necessary to determine patient infection status. Positive results do not rule out bacterial infection or co-infection with other viruses.  The expected result is Negative. Fact Sheet for Patients: SugarRoll.be Fact Sheet for Healthcare Providers: https://www.woods-mathews.com/ This test is not yet approved or cleared by the [place] FDA and  has been authorized for detection and/or diagnosis of SARS-CoV-2 by FDA under an Emergency Use Authorization (EUA). This EUA  will remain  in effect (meaning this test can be used) for th e duration of the COVID-19 declaration under Section 564(b)(1) of the Act, 21 U.S.C. section 360bbb-3(b)(1), unless the authorization is terminated or revoked sooner. Performed at [facility], [place] [address], [place], [place] [address]   Blood culture (routine x 2)     Status: None   Collection Time: 06/08/19 11:09 PM   Specimen: BLOOD  Result  Value Ref Range Status   Specimen Description   Final    BLOOD Blood Culture results may not be optimal due to an excessive volume of blood received in culture bottles   Special Requests   Final    BOTTLES DRAWN AEROBIC AND ANAEROBIC BLOOD LEFT HAND   Culture   Final    NO GROWTH 5 DAYS Performed at [facility], [address] [place], [place] [address]    Report Status 06/14/2019 FINAL  Final  Blood culture (routine x 2)     Status: None   Collection Time: 06/08/19 11:09 PM   Specimen: BLOOD  Result Value Ref Range Status   Specimen Description BLOOD Blood Culture adequate volume  Final   Special Requests BOTTLES DRAWN AEROBIC AND ANAEROBIC BLOOD LEFT ARM  Final   Culture   Final    NO GROWTH 5 DAYS Performed at [facility], [address] [place], [place] [address]    Report Status 06/14/2019 FINAL  Final     Time coordinating discharge:  45 minutes  SIGNED:   Dorcas Carrow, MD  [facility] 06/15/2019, 11:42 AM

## 2019-06-15 NOTE — Evaluation (Signed)
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 PMH includes DM2, HTN.    Clinical Impression  Patient evaluated by Physical Therapy with no further acute PT needs identified. PTA, pt independent, works and lives with family in [place] (got sick while driving commercial truck to [place]). Today, pt independent with mobility and ADLs. SpO2 92-94% on RA. All education has been completed and the patient has no further questions. Acute PT is signing off. Thank you for this referral.    Follow Up Recommendations No PT follow up    Equipment Recommendations  None recommended by PT    Recommendations for Other Services       Precautions / Restrictions Precautions Precautions: None Restrictions Weight Bearing Restrictions: No      Mobility  Bed Mobility Overal bed mobility: Independent                Transfers Overall transfer level: Independent Equipment used: None                Ambulation/Gait Ambulation/Gait assistance: Independent Gait Distance (Feet): 600 Feet Assistive device: None Gait Pattern/deviations: WFL(Within Functional Limits)   Gait velocity interpretation: 1.31 - 2.62 ft/sec, indicative of limited community ambulator General Gait Details: SpO2 92-94% on RA while ambulating; independent  Careers information officer    Modified Rankin (Stroke Patients Only)       Balance Overall balance assessment: No apparent balance deficits (not formally assessed)                                           Pertinent Vitals/Pain Pain Assessment: No/denies pain    Home Living Family/patient expects to be discharged to:: Private residence Living  Arrangements: Spouse/significant other;Children Available Help at Discharge: Family;Available PRN/intermittently Type of Home: Apartment Home Access: Stairs to enter Entrance Stairs-Rails: Right Entrance Stairs-Number of Steps: flight Home Layout: One level Home Equipment: None Additional Comments: Lives in [place], was driving a truck to [place] when he got sick and had to be admitted. Plans to stay in hotel room for quarantine a few more days before drive home    Prior Function Level of Independence: Independent         Comments: Commercial truck driving.     Hand Dominance        Extremity/Trunk Assessment   Upper Extremity Assessment Upper Extremity Assessment: Overall WFL for tasks assessed    Lower Extremity Assessment Lower Extremity Assessment: Overall WFL for tasks assessed    Cervical / Trunk Assessment Cervical / Trunk Assessment: Normal  Communication   Communication: No difficulties  Cognition Arousal/Alertness: Awake/alert Behavior During Therapy: WFL for tasks assessed/performed Overall Cognitive Status: Within Functional Limits for tasks assessed                                        General Comments General comments (skin integrity, edema, etc.): Educ re: energy conservation, activity recommendations, flutter valve/IS (able to demonstrate good technique 5x/ea)    Exercises  Assessment/Plan    PT Assessment Patent does not need any further PT services  PT Problem List         PT Treatment Interventions      PT Goals (Current goals can be found in the Care Plan section)  Acute Rehab PT Goals PT Goal Formulation: All assessment and education complete, DC therapy    Frequency     Barriers to discharge        Co-evaluation               AM-PAC PT "6 Clicks" Mobility  Outcome Measure Help needed turning from your back to your side while in a flat bed without using bedrails?: None Help needed moving from  lying on your back to sitting on the side of a flat bed without using bedrails?: None Help needed moving to and from a bed to a chair (including a wheelchair)?: None Help needed standing up from a chair using your arms (e.g., wheelchair or bedside chair)?: None Help needed to walk in hospital room?: None Help needed climbing 3-5 steps with a railing? : None 6 Click Score: 24    End of Session   Activity Tolerance: Patient tolerated treatment well Patient left: in chair;with call bell/phone within reach Nurse Communication: Mobility status PT Visit Diagnosis: Other abnormalities of gait and mobility (R26.89)    Time: 3220-2542 PT Time Calculation (min) (ACUTE ONLY): 19 min   Charges:   PT Evaluation $PT Eval Low Complexity: [place], PT, DPT Acute Rehabilitation Services  Pager [phone] Office [place] 06/15/2019, 12:54 PM

## 2019-06-15 NOTE — TOC Progression Note (Signed)
Transition of Care [facility]) - Progression Note    Patient Details  Name: James York MRN: [id number] Date of Birth: 01/29/1956  Transition of Care [facility]) CM/SW Contact  Eileen Stanford, LCSW Phone Number: 06/15/2019, 1:48 PM  Clinical Narrative:   CSW supervisor will get pt's meds to him at [facility] today around 4:15. Pt is checking on [address]. If there is a room available CSW will set up PTAR for 5:00. Forms for PTAR will be printed at [facility], RN to please place them on chart.         Expected Discharge Plan and Services           Expected Discharge Date: 06/15/19                                     Social Determinants of Health (SDOH) Interventions    Readmission Risk Interventions No flowsheet data found.

## 2019-06-15 NOTE — Progress Notes (Signed)
OT Cancellation Note  Patient Details Name: James York MRN: [id number] DOB: 1956-06-07   Cancelled Treatment:    Reason Eval/Treat Not Completed: OT screened, no needs identified, will sign off; spoke with evaluating PT who reports pt independent with ADL and mobility today, mobilizing well on RA. Noted pt to d/c today. Will sign off as no acute OT needs identified at this time. Thank you for this referral. Please re-consult should pt's needs change.  Lou Cal, OT Supplemental Rehabilitation Services Pager [phone] Office [phone]   Raymondo Band 06/15/2019, 2:57 PM

## 2020-05-20 IMAGING — CT CT ANGIO CHEST
2 of 6 series · 18 of 46 positions shown · IV contrast (APPLIED)
Comparison: Chest x-ray June 08, 2019

CLINICAL DATA: Positive PKQEW-YH test. Chest pain and shortness of
breath.

EXAM:
CT ANGIOGRAPHY CHEST WITH CONTRAST
TECHNIQUE: Multidetector CT imaging of the chest was performed using the
standard protocol during bolus administration of intravenous
contrast. Multiplanar CT image reconstructions and MIPs were
obtained to evaluate the vascular anatomy.
CONTRAST:  75mL OMNIPAQUE IOHEXOL 350 MG/ML SOLN

[Series 5: thins · axial · 0.82mm/px · z∈[-647,-389]mm · 16 of 284 slices shown]
[im 13/284  lung]
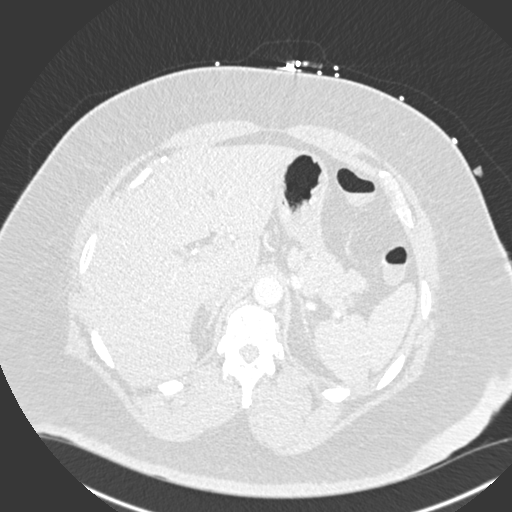
[im 37/284  soft-tissue]
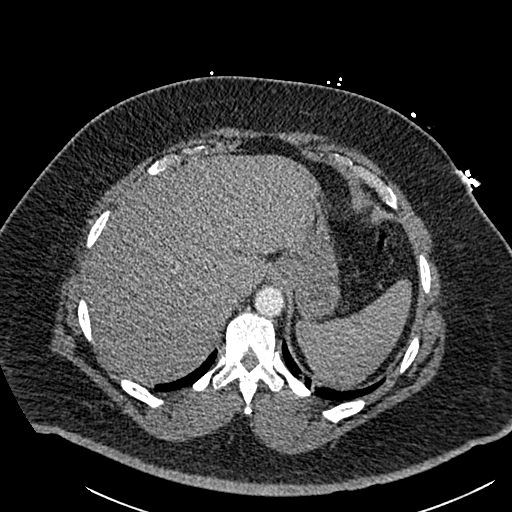
[im 50/284  lung]
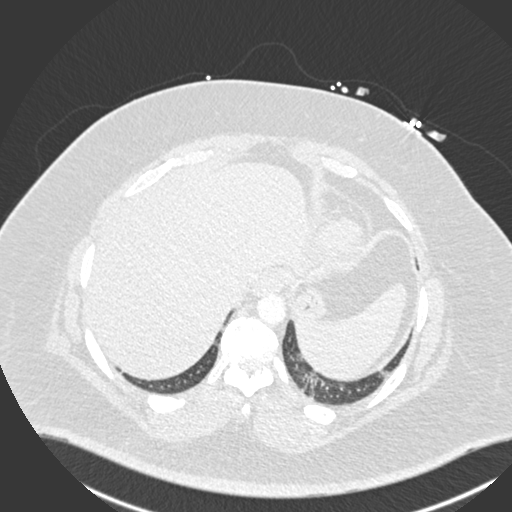
[im 62/284  soft-tissue]
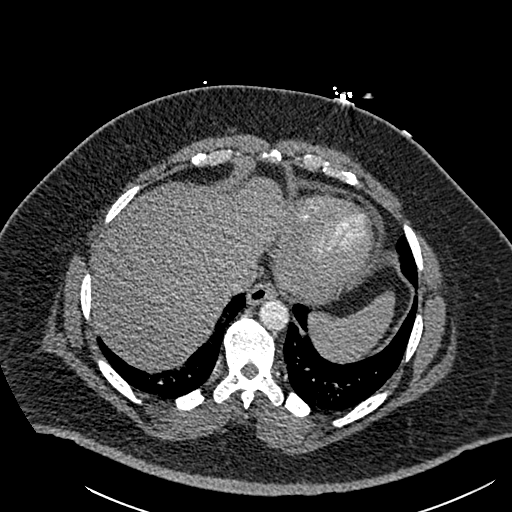
[im 87/284  lung]
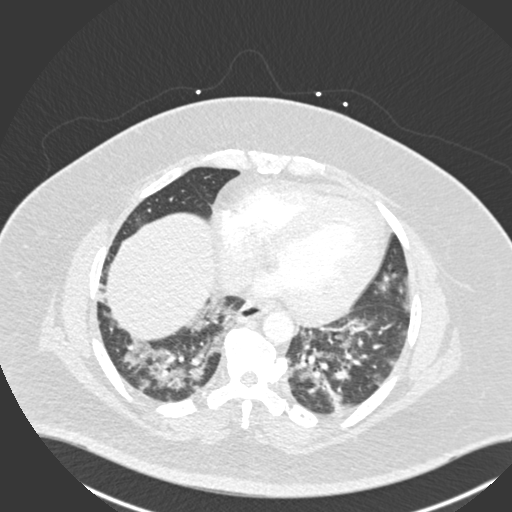
[im 99/284  soft-tissue]
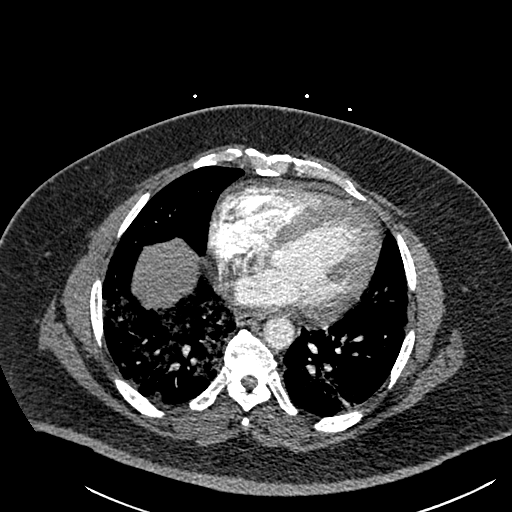
[im 111/284  lung]
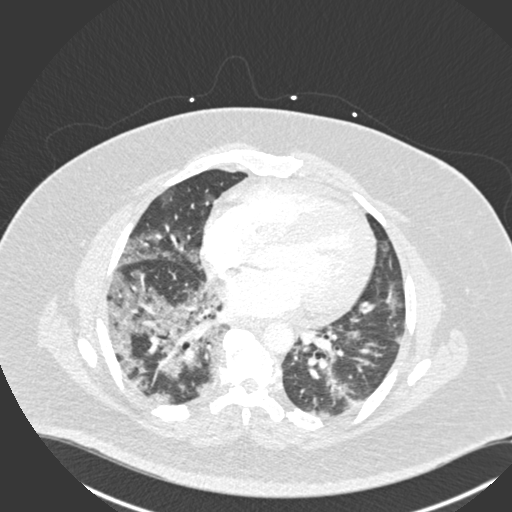
[im 136/284  soft-tissue]
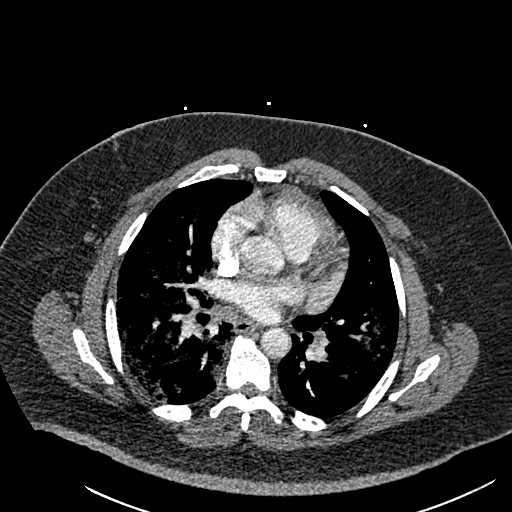
[im 148/284  lung]
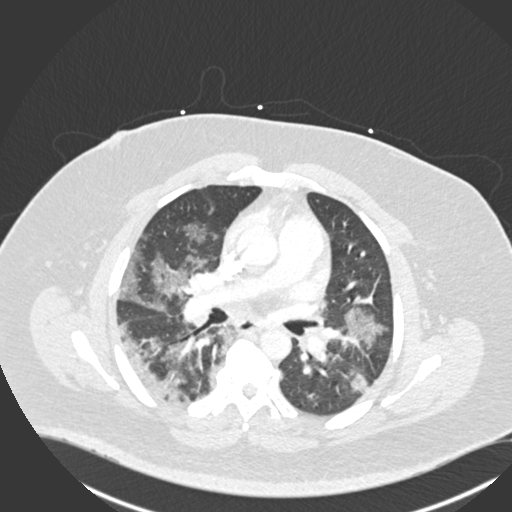
[im 173/284  soft-tissue]
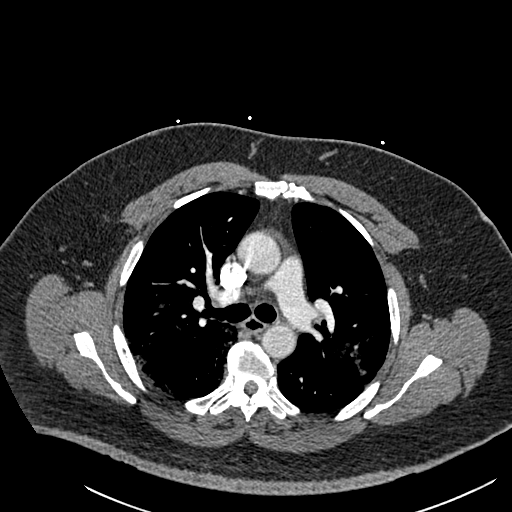
[im 185/284  lung]
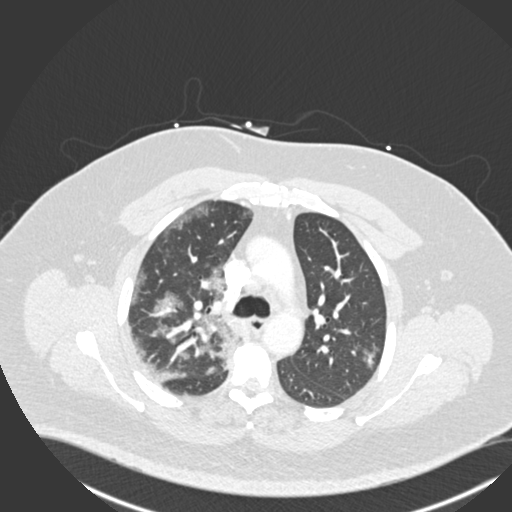
[im 197/284  soft-tissue]
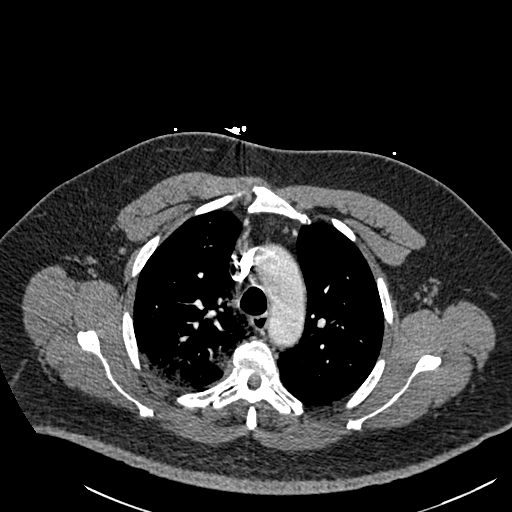
[im 222/284  lung]
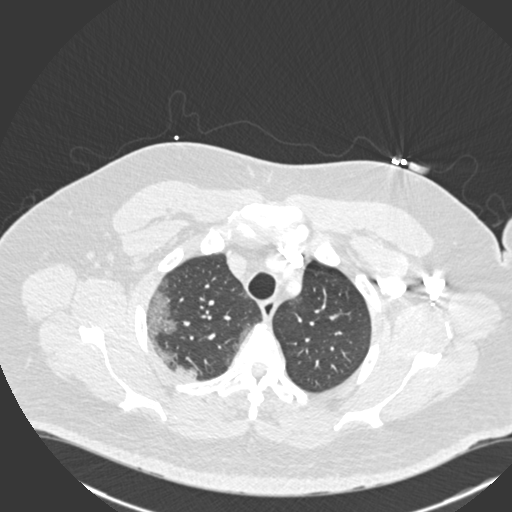
[im 234/284  soft-tissue]
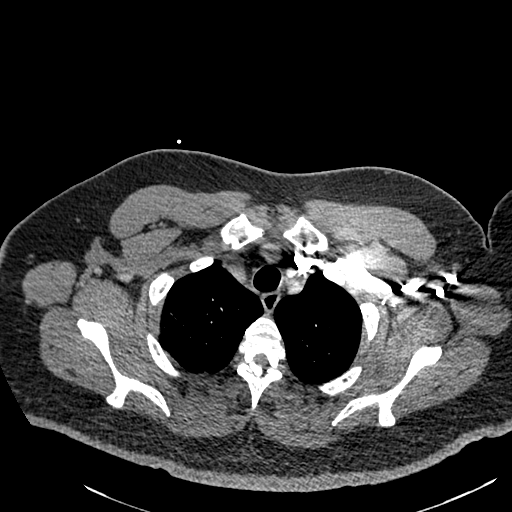
[im 247/284  lung]
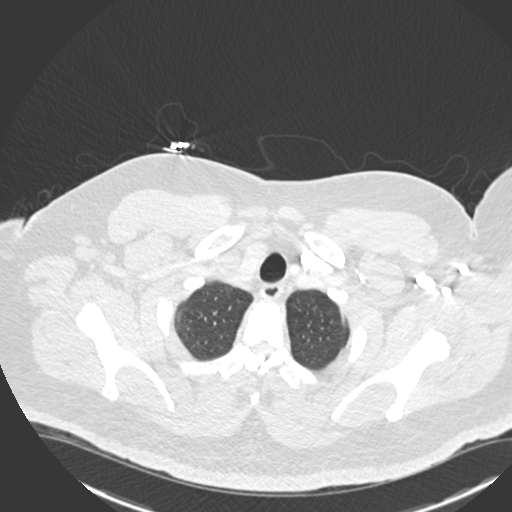
[im 271/284  soft-tissue]
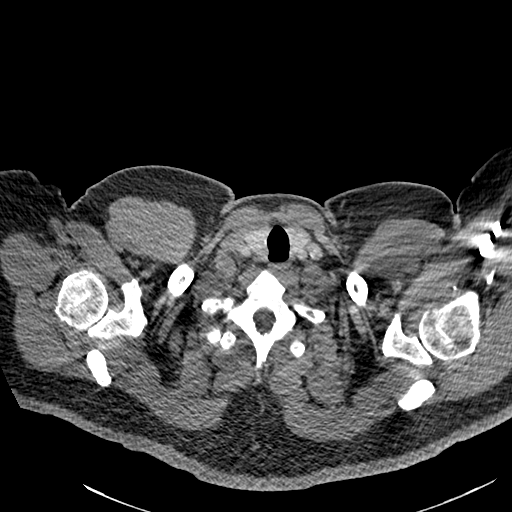

[Series 7: coronal mpr · coronal · 0.56mm/px · 2 of 96 slices shown]
[im 32/96  soft-tissue]
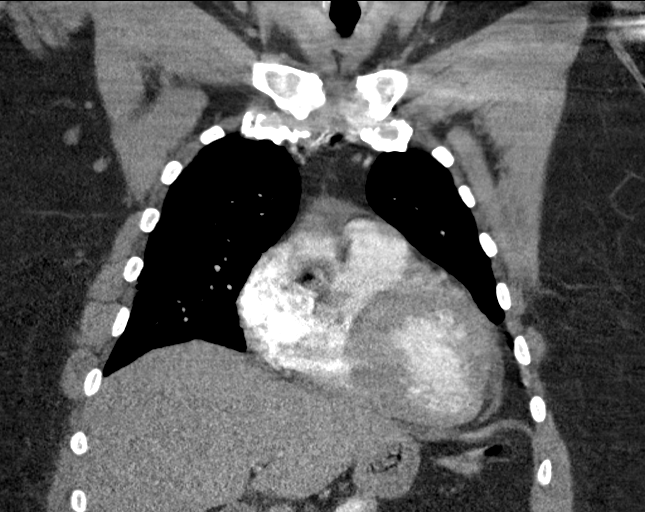
[im 64/96  soft-tissue]
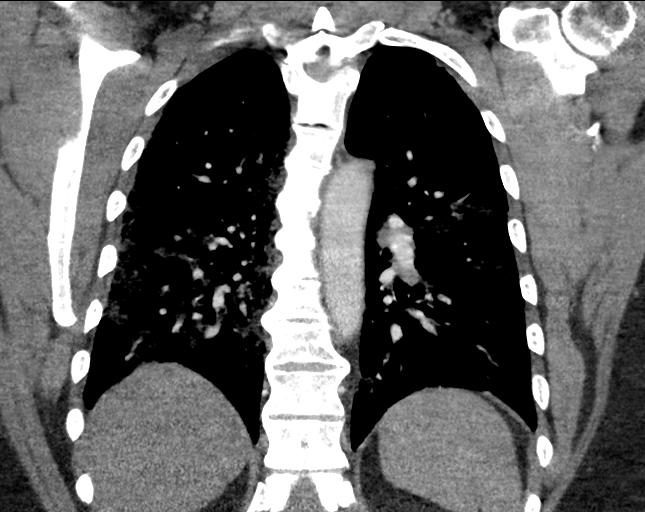

[18 of 46 positions shown; findings below may reference images not displayed]

FINDINGS: Cardiovascular: The heart is normal in size. Mild coronary artery
calcifications are seen in the LAD. The thoracic aorta is normal.
Central pulmonary arteries are normal in caliber. Evaluation for
pulmonary emboli is somewhat limited due to respiratory motion and
stairstep artifact. Within these limitations, no pulmonary emboli
are identified.

Mediastinum/Nodes: Shotty reactive nodes in the mediastinum. No
effusions. The thyroid and esophagus are normal. There is a small
pericardial effusion measuring 7 mm in thickness.

Lungs/Pleura: Central airways are normal. No pneumothorax. Bilateral
pulmonary infiltrates involving all lobes are consistent with the
patient's PKQEW-YH diagnosis.

Upper Abdomen: No acute abnormality.

Musculoskeletal: No chest wall abnormality. No acute or significant
osseous findings.

Review of the MIP images confirms the above findings.
IMPRESSION: 1. Bilateral pulmonary infiltrates consistent with the patient's
PKQEW-YH diagnosis.
2. Evaluation for pulmonary emboli is somewhat limited due to
respiratory motion and stairstep artifact. However, within these
limitations, no emboli are seen.
3. Mild coronary artery calcifications in the LAD.
4. Reactive nodes in the mediastinum.

## 2021-10-03 DEATH — deceased
